# Patient Record
Sex: Male | Born: 1955 | Race: White | Hispanic: No | Marital: Married | Smoking: Current every day smoker
Health system: Southern US, Community
[De-identification: ages and names within clinical notes are randomized; demographics above are authoritative.]

## PROBLEM LIST (undated history)

## (undated) DIAGNOSIS — M25569 Pain in unspecified knee: Secondary | ICD-10-CM

## (undated) DIAGNOSIS — R569 Unspecified convulsions: Secondary | ICD-10-CM

## (undated) DIAGNOSIS — M1990 Unspecified osteoarthritis, unspecified site: Secondary | ICD-10-CM

## (undated) DIAGNOSIS — E119 Type 2 diabetes mellitus without complications: Secondary | ICD-10-CM

## (undated) DIAGNOSIS — M549 Dorsalgia, unspecified: Secondary | ICD-10-CM

## (undated) DIAGNOSIS — M797 Fibromyalgia: Secondary | ICD-10-CM

## (undated) HISTORY — PX: KNEE SURGERY: SHX244

---

## 2013-06-09 ENCOUNTER — Encounter (HOSPITAL_COMMUNITY): Payer: Self-pay | Admitting: Emergency Medicine

## 2013-06-09 ENCOUNTER — Emergency Department (HOSPITAL_COMMUNITY)
Admission: EM | Admit: 2013-06-09 | Discharge: 2013-06-09 | Disposition: A | Discharge status: Home / Self Care | Payer: Self-pay | Attending: Emergency Medicine | Admitting: Emergency Medicine

## 2013-06-09 DIAGNOSIS — E119 Type 2 diabetes mellitus without complications: Secondary | ICD-10-CM | POA: Insufficient documentation

## 2013-06-09 DIAGNOSIS — R55 Syncope and collapse: Secondary | ICD-10-CM | POA: Insufficient documentation

## 2013-06-09 DIAGNOSIS — F17200 Nicotine dependence, unspecified, uncomplicated: Secondary | ICD-10-CM | POA: Insufficient documentation

## 2013-06-09 DIAGNOSIS — Z8669 Personal history of other diseases of the nervous system and sense organs: Secondary | ICD-10-CM | POA: Insufficient documentation

## 2013-06-09 DIAGNOSIS — Z79899 Other long term (current) drug therapy: Secondary | ICD-10-CM | POA: Insufficient documentation

## 2013-06-09 DIAGNOSIS — Z794 Long term (current) use of insulin: Secondary | ICD-10-CM | POA: Insufficient documentation

## 2013-06-09 DIAGNOSIS — M549 Dorsalgia, unspecified: Secondary | ICD-10-CM | POA: Insufficient documentation

## 2013-06-09 HISTORY — DX: Fibromyalgia: M79.7

## 2013-06-09 HISTORY — DX: Type 2 diabetes mellitus without complications: E11.9

## 2013-06-09 HISTORY — DX: Dorsalgia, unspecified: M54.9

## 2013-06-09 HISTORY — DX: Unspecified convulsions: R56.9

## 2013-06-09 LAB — POCT I-STAT, CHEM 8
BUN: 17 mg/dL (ref 6–23)
Calcium, Ion: 1.2 mmol/L (ref 1.12–1.23)
Creatinine, Ser: 0.8 mg/dL (ref 0.50–1.35)
Potassium: 3.9 mEq/L (ref 3.5–5.1)
Sodium: 141 mEq/L (ref 135–145)
TCO2: 21 mmol/L (ref 0–100)

## 2013-06-09 LAB — POCT I-STAT TROPONIN I

## 2013-06-09 MED ORDER — DIAZEPAM 5 MG PO TABS
5.0000 mg | ORAL_TABLET | Freq: Once | ORAL | Status: AC
  Administered 2013-06-09: 5 mg via ORAL
  Filled 2013-06-09: qty 1

## 2013-06-09 MED ORDER — HYDROCODONE-ACETAMINOPHEN 5-325 MG PO TABS
1.0000 | ORAL_TABLET | Freq: Once | ORAL | Status: DC
  Filled 2013-06-09: qty 1

## 2013-06-09 MED ORDER — OXYCODONE-ACETAMINOPHEN 5-325 MG PO TABS
1.0000 | ORAL_TABLET | Freq: Once | ORAL | Status: AC
  Administered 2013-06-09: 1 via ORAL
  Filled 2013-06-09: qty 1

## 2013-06-09 MED ORDER — OXYCODONE-ACETAMINOPHEN 5-325 MG PO TABS: 1.0000 | ORAL_TABLET | Freq: Three times a day (TID) | ORAL | Status: DC | PRN

## 2013-06-09 MED ORDER — DIAZEPAM 5 MG PO TABS: 5.0000 mg | ORAL_TABLET | Freq: Two times a day (BID) | ORAL | Status: DC | PRN

## 2013-06-09 MED ORDER — HYDROCODONE-ACETAMINOPHEN 5-325 MG PO TABS: 1.0000 | ORAL_TABLET | Freq: Three times a day (TID) | ORAL | Status: DC | PRN

## 2013-06-09 NOTE — ED Notes (Signed)
MD at bedside. 

## 2013-06-09 NOTE — ED Notes (Signed)
Pt was outside his house in front yard. Bystander found him falling to his knees.  "I hurt my back."

## 2013-06-09 NOTE — ED Provider Notes (Signed)
CSN: [id number]     Arrival date & time 06/09/13  1004 History   First MD Initiated Contact with Patient 06/09/13 1005     Chief Complaint  Patient presents with  . Loss of Consciousness   (Consider location/radiation/quality/duration/timing/severity/associated sxs/prior Treatment) HPI Patient presents after a near-syncopal episode. On exam the patient has no active complaints beyond back pain, which he repeatedly states is unchanged from baseline. Today, just prior to EMS arrival, the patient had an episode of lightheadedness, while he had a back pain exacerbation.  He specifically denies chest pain, headache, confusion, disorientation. He states that he has had multiple similar episodes in the past, typically when he is feeling back pain exacerbation. He also denies any incontinence, bowel or bladder changes recently. Patient states that he takes his medication as directed.    Past Medical History  Diagnosis Date  . Back pain   . Diabetes mellitus without complication   . Fibromyalgia   . Seizures    Past Surgical History  Procedure Laterality Date  . Knee surgery     History reviewed. No pertinent family history. History  Substance Use Topics  . Smoking status: Current Every Day Smoker -- 1.00 packs/day    Types: Cigarettes  . Smokeless tobacco: Not on file  . Alcohol Use: No    Review of Systems  Constitutional:       Per HPI, otherwise negative  HENT:       Per HPI, otherwise negative  Respiratory:       Per HPI, otherwise negative  Cardiovascular:       Per HPI, otherwise negative  Gastrointestinal: Negative for vomiting.  Endocrine:       Negative aside from HPI  Genitourinary:       Neg aside from HPI   Musculoskeletal:       Per HPI, otherwise negative  Skin: Negative.   Neurological: Positive for light-headedness. Negative for syncope and headaches.    Allergies  Review of patient's allergies indicates no known allergies.  Home Medications    Current Outpatient Rx  Name  Route  Sig  Dispense  Refill  . insulin aspart (NOVOLOG) 100 UNIT/ML injection   Subcutaneous   Inject 30 Units into the skin daily before breakfast.         . metFORMIN (GLUCOPHAGE) 1000 MG tablet   Oral   Take 1,000 mg by mouth 2 (two) times daily with a meal.         . pregabalin (LYRICA) 300 MG capsule   Oral   Take 300 mg by mouth 3 (three) times daily.          BP 117/74  Pulse 93  Temp(Src) 97.6 F (36.4 C) (Oral)  Resp 16  SpO2 98% Physical Exam  Nursing note and vitals reviewed. Constitutional: He is oriented to person, place, and time. He appears well-developed. No distress.  HENT:  Head: Normocephalic and atraumatic.  Eyes: Conjunctivae and EOM are normal.  Cardiovascular: Normal rate and regular rhythm.   Pulmonary/Chest: Effort normal. No stridor. No respiratory distress.  Abdominal: He exhibits no distension.  Musculoskeletal: He exhibits no edema.  Neurological: He is alert and oriented to person, place, and time. He displays no atrophy and no tremor. No cranial nerve deficit or sensory deficit. He exhibits normal muscle tone. He displays no seizure activity. Coordination and gait normal.  Skin: Skin is warm and dry.  Psychiatric: He has a normal mood and affect.    ED Course  Procedures (including critical care time) Labs Review Labs Reviewed  GLUCOSE, CAPILLARY - Abnormal; Notable for the following:    Glucose-Capillary 381 (*)    All other components within normal limits  POCT CBG (FASTING - GLUCOSE)-MANUAL ENTRY   Imaging Review No results found.  EKG Interpretation   None      EKG has sinus rhythm, rate 88, no ischemic changes.   Update: Patient sleeping   Update: Patient's sleep   Update: I discussed all results again with the patient.  He continues to have low back pain, unchanged from baseline.  No additional episodes of near syncope, chest pain, lightheadedness. Patient will be discharged with  analgesics for his back pain, counseling on the need for medication compliance, and primary care followup. MDM  No diagnosis found. Patient returns after an episode of near syncope.  Patient endorses recurrent similar episodes, typically when he has back pain exacerbations, which he was experiencing today.  Patient describes no new retroflex for neurologic compromise, has no chest pain, no dysrhythmia on EKG, and there is low suspicion for atypical dysrhythmia.  Patient had substantial improvement, was sleeping here, and was appropriate for discharge after a period of monitoring.    Gerhard Munch, MD 06/09/13 1325

## 2013-06-09 NOTE — ED Notes (Signed)
Sela Hua (son) [phone] call when pt is ready to be discharged.

## 2013-06-09 NOTE — ED Notes (Signed)
"  I feel much better now. I feel perfect. The only thing that is bothering me is my back but that is normal."

## 2013-06-09 NOTE — ED Notes (Signed)
Pt refusing Vicodin, "it upsets my stomach."

## 2013-06-09 NOTE — ED Notes (Signed)
Pt requesting pain medication for back. MD notified.

## 2013-09-03 ENCOUNTER — Encounter (HOSPITAL_COMMUNITY): Payer: Self-pay | Admitting: Emergency Medicine

## 2013-09-03 ENCOUNTER — Emergency Department (HOSPITAL_COMMUNITY)
Admission: EM | Admit: 2013-09-03 | Discharge: 2013-09-03 | Disposition: A | Discharge status: Home / Self Care | Payer: BC Managed Care – PPO | Attending: Emergency Medicine | Admitting: Emergency Medicine

## 2013-09-03 ENCOUNTER — Emergency Department (HOSPITAL_COMMUNITY): Payer: BC Managed Care – PPO

## 2013-09-03 DIAGNOSIS — M25569 Pain in unspecified knee: Secondary | ICD-10-CM | POA: Insufficient documentation

## 2013-09-03 DIAGNOSIS — IMO0001 Reserved for inherently not codable concepts without codable children: Secondary | ICD-10-CM | POA: Insufficient documentation

## 2013-09-03 DIAGNOSIS — Z794 Long term (current) use of insulin: Secondary | ICD-10-CM | POA: Insufficient documentation

## 2013-09-03 DIAGNOSIS — Z79899 Other long term (current) drug therapy: Secondary | ICD-10-CM | POA: Insufficient documentation

## 2013-09-03 DIAGNOSIS — F17200 Nicotine dependence, unspecified, uncomplicated: Secondary | ICD-10-CM | POA: Insufficient documentation

## 2013-09-03 DIAGNOSIS — M25562 Pain in left knee: Secondary | ICD-10-CM

## 2013-09-03 DIAGNOSIS — M549 Dorsalgia, unspecified: Secondary | ICD-10-CM | POA: Insufficient documentation

## 2013-09-03 DIAGNOSIS — R569 Unspecified convulsions: Secondary | ICD-10-CM | POA: Insufficient documentation

## 2013-09-03 DIAGNOSIS — E119 Type 2 diabetes mellitus without complications: Secondary | ICD-10-CM | POA: Insufficient documentation

## 2013-09-03 DIAGNOSIS — G8929 Other chronic pain: Secondary | ICD-10-CM | POA: Insufficient documentation

## 2013-09-03 MED ORDER — OXYCODONE-ACETAMINOPHEN 5-325 MG PO TABS
2.0000 | ORAL_TABLET | Freq: Once | ORAL | Status: AC
  Administered 2013-09-03: 2 via ORAL
  Filled 2013-09-03: qty 2

## 2013-09-03 MED ORDER — OXYCODONE HCL 5 MG PO TABS: 5.0000 mg | ORAL_TABLET | ORAL | Status: DC | PRN

## 2013-09-03 NOTE — ED Notes (Signed)
Pt is scheduled for knee surgery, pt is here with severe left knee pain.  CBG 150-200.

## 2013-09-03 NOTE — ED Notes (Signed)
Pt states that he is a [employer] and works long hours with physical work. Pt states that his left knee is hurting him more than usual. Pt has bone on bone from CT scan earlier this year and needs a replacement. Pt state that he took his wife's medication (dilaudid she is a cancer patient) and that only helped for a couple of hours.

## 2013-09-03 NOTE — ED Provider Notes (Signed)
CSN: [id number]     Arrival date & time 09/03/13  1647 History  This chart was scribed for non-physician practitioner Antony MaduraKelly Faelyn Sigler, PA-C working with Hurman HornJohn M Bednar, MD by Donne Anonayla Curran, ED Scribe. This patient was seen in room TR11C/TR11C and the patient's care was started at 1813.  First MD Initiated Contact with Patient 09/03/13 1813     Chief Complaint  Patient presents with  . Knee Pain    The history is provided by the patient. No language interpreter was used.   HPI Comments: Charles Blevins is a 58 y.o. male with hx of DM, fibromyalgia and seizures, who presents to the Emergency Department complaining of chronic gradual onset, gradually worsening, severe left knee pain described as stabbing and burning which acutely worsened over the past few days due to strenuous activity at work. He denies recent trauma or injury. He tried ibuprofen, Aleve, Percocet, knee brace, and ice with little relief. [place] but [place] was [place] due to his uncontrolled DM (his CBG used to be >500). He denies fever, redness of the knee or any other symptoms. He has tried diet, insulin and Metformin to control his diabetes.   Past Medical History  Diagnosis Date  . Back pain   . Diabetes mellitus without complication   . Fibromyalgia   . Seizures    Past Surgical History  Procedure Laterality Date  . Knee surgery     No family history on file. History  Substance Use Topics  . Smoking status: Current Every Day Smoker -- 1.00 packs/day    Types: Cigarettes  . Smokeless tobacco: Not on file  . Alcohol Use: No    Review of Systems  Musculoskeletal: Positive for arthralgias.    Allergies  Review of patient's allergies indicates no known allergies.  Home Medications   Current Outpatient Rx  Name  Route  Sig  Dispense  Refill  . diazepam (VALIUM) 5 MG tablet   Oral   Take 1 tablet (5 mg total) by mouth every 12 (twelve) hours as needed (muscle spasm).   15  tablet   0   . insulin aspart (NOVOLOG) 100 UNIT/ML injection   Subcutaneous   Inject 10-16 Units into the skin 3 (three) times daily before meals.          . insulin glargine (LANTUS) 100 UNIT/ML injection   Subcutaneous   Inject 30 Units into the skin at bedtime.         . metFORMIN (GLUCOPHAGE) 1000 MG tablet   Oral   Take 1,000 mg by mouth 2 (two) times daily with a meal.         . oxyCODONE (ROXICODONE) 5 MG immediate release tablet   Oral   Take 1 tablet (5 mg total) by mouth every 4 (four) hours as needed for severe pain.   27 tablet   0   . oxyCODONE-acetaminophen (PERCOCET/ROXICET) 5-325 MG per tablet   Oral   Take 1 tablet by mouth every 8 (eight) hours as needed for pain.   15 tablet   0   . pregabalin (LYRICA) 300 MG capsule   Oral   Take 300 mg by mouth 3 (three) times daily.          BP 131/88  Pulse 86  Temp(Src) 98.1 F (36.7 C) (Oral)  Resp 16  Wt 230 lb (104.327 kg)  SpO2 96%  Physical Exam  Nursing note and vitals reviewed. Constitutional: He is  oriented to person, place, and time. He appears well-developed and well-nourished. No distress.  HENT:  Head: Normocephalic and atraumatic.  Eyes: Conjunctivae and EOM are normal. No scleral icterus.  Neck: Normal range of motion.  Cardiovascular: Normal rate, regular rhythm and intact distal pulses.   DP and PT pulses 2+ in left lower extremity  Pulmonary/Chest: Effort normal. No respiratory distress.  Musculoskeletal: Normal range of motion. He exhibits tenderness. He exhibits no edema.  Normal range of motion of left knee. Tenderness to palpation diffusely to the anterior knee joint, especially inferior to patella and to medial and lateral joint lines. 5 out of 5 strength resistance with flexion and extension of knee. No laxity appreciated. No swelling, erythema, effusions, or deformities.  Neurological: He is alert and oriented to person, place, and time. He has normal reflexes.  No gross  sensory deficits appreciated.  Skin: Skin is warm and dry. No rash noted. He is not diaphoretic. No erythema. No pallor.  Psychiatric: He has a normal mood and affect. His behavior is normal.    ED Course  Procedures (including critical care time) DIAGNOSTIC STUDIES: Oxygen Saturation is 97% on RA, adequate by my interpretation.    COORDINATION OF CARE: 6:55 PM Discussed treatment plan which includes 5-325 mg Percocet in Ed with pt at bedside and pt agreed to plan. Imaging discussed. Will give a note for work. Advised pt to follow up with orthopedist, referral given. Will discharge home with 5 mg Roxicodone tablets.    Labs Review Labs Reviewed - No data to display Imaging Review Dg Knee Complete 4 Views Left  09/03/2013   CLINICAL DATA:  Left knee pain  EXAM: LEFT KNEE - COMPLETE 4+ VIEW  COMPARISON:  None.  FINDINGS: No acute fracture dislocation. No joint effusion. Moderate degenerative joint space narrowing with juxta-articular osteophytosis is present at the medial joint space compartment. Minimal degenerative spurring seen at the inferior aspect of the patella. Corticated density at the posterior aspect of the femorotibial joint space on lateral projection may represent a loose body. No soft tissue swelling. Osseous mineralization is normal.  IMPRESSION: 1. 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 2. No acute fracture or dislocation.  No joint effusion.   Electronically Signed   By: Rise Mu M.D.   On: 09/03/2013 18:42    EKG Interpretation   None       MDM   1. Knee pain, left    Uncomplicated left knee pain. Patient neurovascularly intact. No gross sensory deficits appreciated. Normal reflexes in left lower extremity as well as strength against resistance with knee flexion and extension. X-ray negative for fracture or dislocation. No evidence of septic joint. Patient stable for discharge with prescription for  oxycodone for pain control. Orthopedic referral provided. Return precautions discussed and patient agreeable to plan with no unaddressed concerns.  I personally performed the services described in this documentation, which was scribed in my presence. The recorded information has been reviewed and is accurate.    Antony Madura, PA-C 09/03/13 1952

## 2013-09-03 NOTE — Discharge Instructions (Signed)
°Arthralgia °Your caregiver has diagnosed you as suffering from an arthralgia. Arthralgia means there is pain in a joint. This can come from many reasons including: °· Bruising the joint which causes soreness (inflammation) in the joint. °· Wear and tear on the joints which occur as we grow older (osteoarthritis). °· Overusing the joint. °· Various forms of arthritis. °· Infections of the joint. °Regardless of the cause of pain in your joint, most of these different pains respond to anti-inflammatory drugs and rest. The exception to this is when a joint is infected, and these cases are treated with antibiotics, if it is a bacterial infection. °HOME CARE INSTRUCTIONS  °· Rest the injured area for as long as directed by your caregiver. Then slowly start using the joint as directed by your caregiver and as the pain allows. Crutches as directed may be useful if the ankles, knees or hips are involved. If the knee was splinted or casted, continue use and care as directed. If an stretchy or elastic wrapping bandage has been applied today, it should be removed and re-applied every 3 to 4 hours. It should not be applied tightly, but firmly enough to keep swelling down. Watch toes and feet for swelling, bluish discoloration, coldness, numbness or excessive pain. If any of these problems (symptoms) occur, remove the ace bandage and re-apply more loosely. If these symptoms persist, contact your caregiver or return to this location. °· For the first 24 hours, keep the injured extremity elevated on pillows while lying down. °· Apply ice for 15-20 minutes to the sore joint every couple hours while awake for the first half day. Then 03-04 times per day for the first 48 hours. Put the ice in a plastic bag and place a towel between the bag of ice and your skin. °· Wear any splinting, casting, elastic bandage applications, or slings as instructed. °· Only take over-the-counter or prescription medicines for pain, discomfort, or fever  as directed by your caregiver. Do not use aspirin immediately after the injury unless instructed by your physician. Aspirin can cause increased bleeding and bruising of the tissues. °· If you were given crutches, continue to use them as instructed and do not resume weight bearing on the sore joint until instructed. °Persistent pain and inability to use the sore joint as directed for more than 2 to 3 days are warning signs indicating that you should see a caregiver for a follow-up visit as soon as possible. Initially, a hairline fracture (break in bone) may not be evident on X-rays. Persistent pain and swelling indicate that further evaluation, non-weight bearing or use of the joint (use of crutches or slings as instructed), or further X-rays are indicated. X-rays may sometimes not show a small fracture until a week or 10 days later. Make a follow-up appointment with your own caregiver or one to whom we have referred you. A radiologist (specialist in reading X-rays) may read your X-rays. Make sure you know how you are to obtain your X-ray results. Do not assume everything is normal if you do not hear from us. °SEEK MEDICAL CARE IF: °Bruising, swelling, or pain increases. °SEEK IMMEDIATE MEDICAL CARE IF:  °· Your fingers or toes are numb or blue. °· The pain is not responding to medications and continues to stay the same or get worse. °· The pain in your joint becomes severe. °· You develop a fever over 102° F (38.9° C). °· It becomes impossible to move or use the joint. °MAKE SURE YOU:  °·   Understand these instructions. °· Will watch your condition. °· Will get help right away if you are not doing well or get worse. °Document Released: 08/02/2005 Document Revised: 10/25/2011 Document Reviewed: 03/20/2008 °ExitCare® Patient Information ©2014 [employer]. °RICE: Routine Care for Injuries °The routine care of many injuries includes Rest, Ice, Compression, and Elevation (RICE). °HOME CARE INSTRUCTIONS °· Rest is needed  to allow your body to heal. Routine activities can usually be resumed when comfortable. Injured tendons and bones can take up to 6 weeks to heal. Tendons are the cord-like structures that attach muscle to bone. °· Ice following an injury helps keep the swelling down and reduces pain. °· Put ice in a plastic bag. °· Place a towel between your skin and the bag. °· Leave the ice on for 15-20 minutes, 03-04 times a day. Do this while awake, for the first 24 to 48 hours. After that, continue as directed by your caregiver. °· Compression helps keep swelling down. It also gives support and helps with discomfort. If an elastic bandage has been applied, it should be removed and reapplied every 3 to 4 hours. It should not be applied tightly, but firmly enough to keep swelling down. Watch fingers or toes for swelling, bluish discoloration, coldness, numbness, or excessive pain. If any of these problems occur, remove the bandage and reapply loosely. Contact your caregiver if these problems continue. °· Elevation helps reduce swelling and decreases pain. With extremities, such as the arms, hands, legs, and feet, the injured area should be placed near or above the level of the heart, if possible. °SEEK IMMEDIATE MEDICAL CARE IF: °· You have persistent pain and swelling. °· You develop redness, numbness, or unexpected weakness. °· Your symptoms are getting worse rather than improving after several days. °These symptoms may indicate that further evaluation or further X-rays are needed. Sometimes, X-rays may not show a small broken bone (fracture) until 1 week or 10 days later. Make a follow-up appointment with your caregiver. Ask when your X-ray results will be ready. Make sure you get your X-ray results. °Document Released: 11/14/2000 Document Revised: 10/25/2011 Document Reviewed: 01/01/2011 °ExitCare® Patient Information ©2014 [employer]. ° °

## 2013-09-06 NOTE — ED Provider Notes (Signed)
Medical screening examination/treatment/procedure(s) were performed by non-physician practitioner and as supervising physician I was immediately available for consultation/collaboration.  Sahil Milner M Avyn Coate, MD 09/06/13 1854 

## 2013-09-30 ENCOUNTER — Encounter (HOSPITAL_COMMUNITY): Payer: Self-pay | Admitting: Emergency Medicine

## 2013-09-30 ENCOUNTER — Emergency Department (HOSPITAL_COMMUNITY)
Admission: EM | Admit: 2013-09-30 | Discharge: 2013-09-30 | Disposition: A | Discharge status: Home / Self Care | Payer: BC Managed Care – PPO | Attending: Emergency Medicine | Admitting: Emergency Medicine

## 2013-09-30 DIAGNOSIS — G8929 Other chronic pain: Secondary | ICD-10-CM | POA: Insufficient documentation

## 2013-09-30 DIAGNOSIS — Z794 Long term (current) use of insulin: Secondary | ICD-10-CM | POA: Insufficient documentation

## 2013-09-30 DIAGNOSIS — G40909 Epilepsy, unspecified, not intractable, without status epilepticus: Secondary | ICD-10-CM | POA: Insufficient documentation

## 2013-09-30 DIAGNOSIS — Z79899 Other long term (current) drug therapy: Secondary | ICD-10-CM | POA: Insufficient documentation

## 2013-09-30 DIAGNOSIS — Z9889 Other specified postprocedural states: Secondary | ICD-10-CM | POA: Insufficient documentation

## 2013-09-30 DIAGNOSIS — M25569 Pain in unspecified knee: Secondary | ICD-10-CM | POA: Insufficient documentation

## 2013-09-30 DIAGNOSIS — F17200 Nicotine dependence, unspecified, uncomplicated: Secondary | ICD-10-CM | POA: Insufficient documentation

## 2013-09-30 DIAGNOSIS — IMO0001 Reserved for inherently not codable concepts without codable children: Secondary | ICD-10-CM | POA: Insufficient documentation

## 2013-09-30 DIAGNOSIS — E119 Type 2 diabetes mellitus without complications: Secondary | ICD-10-CM | POA: Insufficient documentation

## 2013-09-30 DIAGNOSIS — M5430 Sciatica, unspecified side: Secondary | ICD-10-CM

## 2013-09-30 MED ORDER — OXYCODONE-ACETAMINOPHEN 5-325 MG PO TABS
1.0000 | ORAL_TABLET | Freq: Once | ORAL | Status: AC
  Administered 2013-09-30: 1 via ORAL
  Filled 2013-09-30: qty 1

## 2013-09-30 MED ORDER — OXYCODONE-ACETAMINOPHEN 5-325 MG PO TABS
2.0000 | ORAL_TABLET | ORAL | Status: DC | PRN
Start: 2013-09-30 — End: 2013-11-07

## 2013-09-30 MED ORDER — IBUPROFEN 800 MG PO TABS: 800.0000 mg | ORAL_TABLET | Freq: Three times a day (TID) | ORAL | Status: DC

## 2013-09-30 MED ORDER — IBUPROFEN 400 MG PO TABS
800.0000 mg | ORAL_TABLET | Freq: Once | ORAL | Status: AC
  Administered 2013-09-30: 800 mg via ORAL
  Filled 2013-09-30: qty 2

## 2013-09-30 NOTE — ED Notes (Signed)
Patient discharged to home. NAD.  

## 2013-09-30 NOTE — ED Provider Notes (Signed)
CSN: [id number]     Arrival date & time 09/30/13  0920 History  This chart was scribed for Charles AnisShari Mana Haberl, non-physician practitioner, working with Rolland PorterMark James, MD, by Ellin MayhewMichael Levi, ED Scribe. This patient was seen in room TR11C/TR11C and the patient's care was started at 9:50 AM.  Chief Complaint  Patient presents with  . Back Pain   Patient is a 58 y.o. male presenting with back pain.  Back Pain Associated symptoms: no fever and no weakness    HPI Comments: Charles Blevins is a 58 y.o. male who presents to the Emergency Department complaining of L lower back pain with onset yesterday. Patient reports that he has had chronic back and knee pain which is intermittent and waxes and wanes; however, was at its worse yesterday night. He confirms this being the thrid of such episodes. Patient characterizes the pain as sharp and radiating down his L thigh with diffuse L flank pain. The pain is made worse with movement (bending) of the L knee and change in position. Patient denies any abdominal pain, urinary or bowel incontinence. He denies any allergies to medication; however, he states large percocet medication give him nausea. Patient has a history of DM, and seizures. He does have a history of knee surgery.   Past Medical History  Diagnosis Date  . Back pain   . Diabetes mellitus without complication   . Fibromyalgia   . Seizures    Past Surgical History  Procedure Laterality Date  . Knee surgery     No family history on file. History  Substance Use Topics  . Smoking status: Current Every Day Smoker -- 1.00 packs/day    Types: Cigarettes  . Smokeless tobacco: Not on file  . Alcohol Use: No    Review of Systems  Constitutional: Negative for fever, chills, activity change and appetite change.  Respiratory: Negative for shortness of breath.   Gastrointestinal: Negative for nausea, vomiting and diarrhea.  Genitourinary: Negative for difficulty urinating.  Musculoskeletal: Positive for back  pain (L lower back, L knee, and L flank.).  Neurological: Negative for weakness.  All other systems reviewed and are negative.   Allergies  Review of patient's allergies indicates no known allergies.  Home Medications   Current Outpatient Rx  Name  Route  Sig  Dispense  Refill  . diazepam (VALIUM) 5 MG tablet   Oral   Take 1 tablet (5 mg total) by mouth every 12 (twelve) hours as needed (muscle spasm).   15 tablet   0   . insulin aspart (NOVOLOG) 100 UNIT/ML injection   Subcutaneous   Inject 10-16 Units into the skin 3 (three) times daily before meals.          . insulin glargine (LANTUS) 100 UNIT/ML injection   Subcutaneous   Inject 30 Units into the skin at bedtime.         . metFORMIN (GLUCOPHAGE) 1000 MG tablet   Oral   Take 1,000 mg by mouth 2 (two) times daily with a meal.         . oxyCODONE (ROXICODONE) 5 MG immediate release tablet   Oral   Take 1 tablet (5 mg total) by mouth every 4 (four) hours as needed for severe pain.   27 tablet   0   . oxyCODONE-acetaminophen (PERCOCET/ROXICET) 5-325 MG per tablet   Oral   Take 1 tablet by mouth every 8 (eight) hours as needed for pain.   15 tablet   0   .  pregabalin (LYRICA) 300 MG capsule   Oral   Take 300 mg by mouth 3 (three) times daily.          Triage Vitals: BP 123/77  Pulse 84  Temp(Src) 97.2 F (36.2 C) (Oral)  Resp 20  Ht 5\' 8"  (1.727 m)  Wt 230 lb (104.327 kg)  BMI 34.98 kg/m2  SpO2 98%  Physical Exam  Nursing note and vitals reviewed. Constitutional: He is oriented to person, place, and time. He appears well-developed and well-nourished. No distress.  HENT:  Head: Normocephalic and atraumatic.  Eyes: Conjunctivae and EOM are normal.  Neck: Neck supple.  Cardiovascular: Intact distal pulses.   Pulmonary/Chest: No respiratory distress.  Abdominal: Soft. There is no tenderness.  Musculoskeletal: Normal range of motion.  Tenderness to L sciatic nerve.  Neurological: He is alert and  oriented to person, place, and time.  Reflex Scores:      Patellar reflexes are 2+ on the left side. Skin: Skin is warm and dry.  Psychiatric: He has a normal mood and affect. His behavior is normal.    ED Course  Procedures (including critical care time)  DIAGNOSTIC STUDIES: Oxygen Saturation is 98% on room air, normal by my interpretation.    COORDINATION OF CARE: 9:53 AM-Recommended taking elevated levels of anti-inflammatory medication (ibuprofen 800mg  every 8 hours) and f/u with an orthopedic physician. Also recommendeded using ice and heat to help relax the area. Will prescribe motrin pain medication for patient. Treatment plan discussed with patient and patient agrees.  Labs Review Labs Reviewed - No data to display Imaging Review No results found.  EKG Interpretation   None      MDM   Final diagnoses:  None    1. Sciatica   Uncomplicated recurrent sciatica.   I personally performed the services described in this documentation, which was scribed in my presence. The recorded information has been reviewed and is accurate.     Arnoldo Hooker, PA-C 09/30/13 1426

## 2013-09-30 NOTE — Discharge Instructions (Signed)
Sciatica °Sciatica is pain, weakness, numbness, or tingling along the path of the sciatic nerve. The nerve starts in the lower back and runs down the back of each leg. The nerve controls the muscles in the lower leg and in the back of the knee, while also providing sensation to the back of the thigh, lower leg, and the sole of your foot. Sciatica is a symptom of another medical condition. For instance, nerve damage or certain conditions, such as a herniated disk or bone spur on the spine, pinch or put pressure on the sciatic nerve. This causes the pain, weakness, or other sensations normally associated with sciatica. Generally, sciatica only affects one side of the body. °CAUSES  °· Herniated or slipped disc. °· Degenerative disk disease. °· A pain disorder involving the narrow muscle in the buttocks (piriformis syndrome). °· Pelvic injury or fracture. °· Pregnancy. °· Tumor (rare). °SYMPTOMS  °Symptoms can vary from mild to very severe. The symptoms usually travel from the low back to the buttocks and down the back of the leg. Symptoms can include: °· Mild tingling or dull aches in the lower back, leg, or hip. °· Numbness in the back of the calf or sole of the foot. °· Burning sensations in the lower back, leg, or hip. °· Sharp pains in the lower back, leg, or hip. °· Leg weakness. °· Severe back pain inhibiting movement. °These symptoms may get worse with coughing, sneezing, laughing, or prolonged sitting or standing. Also, being overweight may worsen symptoms. °DIAGNOSIS  °Your caregiver will perform a physical exam to look for common symptoms of sciatica. He or she may ask you to do certain movements or activities that would trigger sciatic nerve pain. Other tests may be performed to find the cause of the sciatica. These may include: °· Blood tests. °· X-rays. °· Imaging tests, such as an MRI or CT scan. °TREATMENT  °Treatment is directed at the cause of the sciatic pain. Sometimes, treatment is not necessary  and the pain and discomfort goes away on its own. If treatment is needed, your caregiver may suggest: °· Over-the-counter medicines to relieve pain. °· Prescription medicines, such as anti-inflammatory medicine, muscle relaxants, or narcotics. °· Applying heat or ice to the painful area. °· Steroid injections to lessen pain, irritation, and inflammation around the nerve. °· Reducing activity during periods of pain. °· Exercising and stretching to strengthen your abdomen and improve flexibility of your spine. Your caregiver may suggest losing weight if the extra weight makes the back pain worse. °· Physical therapy. °· Surgery to eliminate what is pressing or pinching the nerve, such as a bone spur or part of a herniated disk. °HOME CARE INSTRUCTIONS  °· Only take over-the-counter or prescription medicines for pain or discomfort as directed by your caregiver. °· Apply ice to the affected area for 20 minutes, 3 4 times a day for the first 48 72 hours. Then try heat in the same way. °· Exercise, stretch, or perform your usual activities if these do not aggravate your pain. °· Attend physical therapy sessions as directed by your caregiver. °· Keep all follow-up appointments as directed by your caregiver. °· Do not wear high heels or shoes that do not provide proper support. °· Check your mattress to see if it is too soft. A firm mattress may lessen your pain and discomfort. °SEEK IMMEDIATE MEDICAL CARE IF:  °· You lose control of your bowel or bladder (incontinence). °· You have increasing weakness in the lower back,   pelvis, buttocks, or legs. °· You have redness or swelling of your back. °· You have a burning sensation when you urinate. °· You have pain that gets worse when you lie down or awakens you at night. °· Your pain is worse than you have experienced in the past. °· Your pain is lasting longer than 4 weeks. °· You are suddenly losing weight without reason. °MAKE SURE YOU: °· Understand these  instructions. °· Will watch your condition. °· Will get help right away if you are not doing well or get worse. °Document Released: 07/27/2001 Document Revised: 02/01/2012 Document Reviewed: 12/12/2011 °[employer]® Patient Information ©2014 [employer]. ° °

## 2013-09-30 NOTE — ED Notes (Signed)
Patient presents to ED for complaints of left lower back pain since yesterday. Patient reports pain goes down his left thigh. Describes pain as sharp.

## 2013-09-30 NOTE — ED Notes (Signed)
Patient states he took advil, and aleve without relief.

## 2013-10-05 NOTE — ED Provider Notes (Signed)
Medical screening examination/treatment/procedure(s) were performed by non-physician practitioner and as supervising physician I was immediately available for consultation/collaboration.  EKG Interpretation   None         Mayukha Symmonds, MD 10/05/13 0821 

## 2013-11-07 ENCOUNTER — Emergency Department (HOSPITAL_COMMUNITY): Payer: BC Managed Care – PPO

## 2013-11-07 ENCOUNTER — Encounter (HOSPITAL_COMMUNITY): Payer: Self-pay | Admitting: Emergency Medicine

## 2013-11-07 ENCOUNTER — Observation Stay (HOSPITAL_COMMUNITY)
Admission: EM | Admit: 2013-11-07 | Discharge: 2013-11-08 | Disposition: A | Discharge status: Home / Self Care | Payer: BC Managed Care – PPO | Attending: Cardiovascular Disease | Admitting: Cardiovascular Disease

## 2013-11-07 DIAGNOSIS — R0602 Shortness of breath: Secondary | ICD-10-CM | POA: Insufficient documentation

## 2013-11-07 DIAGNOSIS — G40909 Epilepsy, unspecified, not intractable, without status epilepticus: Secondary | ICD-10-CM | POA: Insufficient documentation

## 2013-11-07 DIAGNOSIS — R55 Syncope and collapse: Secondary | ICD-10-CM

## 2013-11-07 DIAGNOSIS — F17200 Nicotine dependence, unspecified, uncomplicated: Secondary | ICD-10-CM | POA: Insufficient documentation

## 2013-11-07 DIAGNOSIS — Z79899 Other long term (current) drug therapy: Secondary | ICD-10-CM | POA: Insufficient documentation

## 2013-11-07 DIAGNOSIS — R072 Precordial pain: Secondary | ICD-10-CM

## 2013-11-07 DIAGNOSIS — I2510 Atherosclerotic heart disease of native coronary artery without angina pectoris: Secondary | ICD-10-CM | POA: Insufficient documentation

## 2013-11-07 DIAGNOSIS — R209 Unspecified disturbances of skin sensation: Secondary | ICD-10-CM | POA: Insufficient documentation

## 2013-11-07 DIAGNOSIS — Z794 Long term (current) use of insulin: Secondary | ICD-10-CM | POA: Insufficient documentation

## 2013-11-07 DIAGNOSIS — R0789 Other chest pain: Principal | ICD-10-CM | POA: Insufficient documentation

## 2013-11-07 DIAGNOSIS — E785 Hyperlipidemia, unspecified: Secondary | ICD-10-CM

## 2013-11-07 DIAGNOSIS — M797 Fibromyalgia: Secondary | ICD-10-CM | POA: Diagnosis present

## 2013-11-07 DIAGNOSIS — L74519 Primary focal hyperhidrosis, unspecified: Secondary | ICD-10-CM | POA: Insufficient documentation

## 2013-11-07 DIAGNOSIS — R079 Chest pain, unspecified: Secondary | ICD-10-CM

## 2013-11-07 DIAGNOSIS — E119 Type 2 diabetes mellitus without complications: Secondary | ICD-10-CM | POA: Insufficient documentation

## 2013-11-07 DIAGNOSIS — E1169 Type 2 diabetes mellitus with other specified complication: Secondary | ICD-10-CM

## 2013-11-07 DIAGNOSIS — M1990 Unspecified osteoarthritis, unspecified site: Secondary | ICD-10-CM

## 2013-11-07 HISTORY — DX: Unspecified osteoarthritis, unspecified site: M19.90

## 2013-11-07 LAB — CBC
HCT: 44.2 % (ref 39.0–52.0)
Hemoglobin: 15.7 g/dL (ref 13.0–17.0)
MCH: 30.3 pg (ref 26.0–34.0)
MCHC: 35.5 g/dL (ref 30.0–36.0)
MCV: 85.3 fL (ref 78.0–100.0)
PLATELETS: 205 10*3/uL (ref 150–400)
RBC: 5.18 MIL/uL (ref 4.22–5.81)
RDW: 13.6 % (ref 11.5–15.5)
WBC: 6.9 10*3/uL (ref 4.0–10.5)

## 2013-11-07 LAB — T4, FREE: Free T4: 1.26 ng/dL (ref 0.80–1.80)

## 2013-11-07 LAB — BASIC METABOLIC PANEL
BUN: 14 mg/dL (ref 6–23)
CALCIUM: 9.4 mg/dL (ref 8.4–10.5)
CO2: 22 mEq/L (ref 19–32)
CREATININE: 0.54 mg/dL (ref 0.50–1.35)
Chloride: 100 mEq/L (ref 96–112)
GFR calc Af Amer: >90 mL/min (ref >90)
Glucose, Bld: 311 mg/dL — ABNORMAL HIGH (ref 70–99)
Potassium: 4.4 mEq/L (ref 3.7–5.3)
Sodium: 135 mEq/L — ABNORMAL LOW (ref 137–147)

## 2013-11-07 LAB — HEMOGLOBIN A1C
Hgb A1c MFr Bld: 10.9 % — ABNORMAL HIGH (ref <5.7)
Mean Plasma Glucose: 266 mg/dL — ABNORMAL HIGH (ref <117)

## 2013-11-07 LAB — TROPONIN I: Troponin I: <0.3 ng/mL (ref <0.30)

## 2013-11-07 LAB — PROTIME-INR
INR: 0.92 (ref 0.00–1.49)
PROTHROMBIN TIME: 12.2 s (ref 11.6–15.2)

## 2013-11-07 LAB — I-STAT TROPONIN, ED: Troponin i, poc: 0 ng/mL (ref 0.00–0.08)

## 2013-11-07 LAB — GLUCOSE, CAPILLARY: GLUCOSE-CAPILLARY: 338 mg/dL — AB (ref 70–99)

## 2013-11-07 LAB — PRO B NATRIURETIC PEPTIDE: Pro B Natriuretic peptide (BNP): 22.8 pg/mL (ref 0–125)

## 2013-11-07 LAB — MAGNESIUM: Magnesium: 1.4 mg/dL — ABNORMAL LOW (ref 1.5–2.5)

## 2013-11-07 LAB — APTT: APTT: 37 s (ref 24–37)

## 2013-11-07 LAB — D-DIMER, QUANTITATIVE (NOT AT ARMC): D DIMER QUANT: 0.31 ug{FEU}/mL (ref 0.00–0.48)

## 2013-11-07 LAB — TSH: TSH: 1.099 u[IU]/mL (ref 0.350–4.500)

## 2013-11-07 MED ORDER — MORPHINE SULFATE 2 MG/ML IJ SOLN
2.0000 mg | INTRAMUSCULAR | Status: DC | PRN
  Administered 2013-11-07 – 2013-11-08 (×4): 2 mg via INTRAVENOUS
  Filled 2013-11-07 (×3): qty 1

## 2013-11-07 MED ORDER — ASPIRIN EC 81 MG PO TBEC
81.0000 mg | DELAYED_RELEASE_TABLET | Freq: Every day | ORAL | Status: DC
  Administered 2013-11-08: 81 mg via ORAL
  Filled 2013-11-07: qty 1

## 2013-11-07 MED ORDER — PREGABALIN 75 MG PO CAPS
300.0000 mg | ORAL_CAPSULE | Freq: Three times a day (TID) | ORAL | Status: DC
  Administered 2013-11-07 – 2013-11-08 (×2): 300 mg via ORAL
  Filled 2013-11-07 (×2): qty 4

## 2013-11-07 MED ORDER — ASPIRIN 300 MG RE SUPP: 300.0000 mg | RECTAL | Status: DC

## 2013-11-07 MED ORDER — INSULIN GLARGINE 100 UNIT/ML ~~LOC~~ SOLN
15.0000 [IU] | Freq: Every day | SUBCUTANEOUS | Status: DC
  Administered 2013-11-07: 15 [IU] via SUBCUTANEOUS
  Filled 2013-11-07 (×2): qty 0.15

## 2013-11-07 MED ORDER — SODIUM CHLORIDE 0.9 % IJ SOLN
3.0000 mL | Freq: Two times a day (BID) | INTRAMUSCULAR | Status: DC
  Administered 2013-11-07: 3 mL via INTRAVENOUS

## 2013-11-07 MED ORDER — METOPROLOL TARTRATE 12.5 MG HALF TABLET
12.5000 mg | ORAL_TABLET | Freq: Two times a day (BID) | ORAL | Status: DC
  Administered 2013-11-07 – 2013-11-08 (×2): 12.5 mg via ORAL
  Filled 2013-11-07 (×3): qty 1

## 2013-11-07 MED ORDER — ASPIRIN 81 MG PO CHEW: 324.0000 mg | CHEWABLE_TABLET | ORAL | Status: DC

## 2013-11-07 MED ORDER — MORPHINE SULFATE 4 MG/ML IJ SOLN
4.0000 mg | Freq: Once | INTRAMUSCULAR | Status: AC
  Administered 2013-11-07: 4 mg via INTRAVENOUS
  Filled 2013-11-07: qty 1

## 2013-11-07 MED ORDER — AMITRIPTYLINE HCL 10 MG PO TABS
10.0000 mg | ORAL_TABLET | Freq: Every evening | ORAL | Status: DC | PRN
  Filled 2013-11-07: qty 1

## 2013-11-07 MED ORDER — ALPRAZOLAM 0.5 MG PO TABS: 0.5000 mg | ORAL_TABLET | Freq: Three times a day (TID) | ORAL | Status: DC | PRN

## 2013-11-07 MED ORDER — PNEUMOCOCCAL VAC POLYVALENT 25 MCG/0.5ML IJ INJ
0.5000 mL | INJECTION | INTRAMUSCULAR | Status: DC
Start: 2013-11-08 — End: 2013-11-08
  Filled 2013-11-07: qty 0.5

## 2013-11-07 MED ORDER — INFLUENZA VAC SPLIT QUAD 0.5 ML IM SUSP
0.5000 mL | INTRAMUSCULAR | Status: DC
  Filled 2013-11-07: qty 0.5

## 2013-11-07 MED ORDER — ONDANSETRON HCL 4 MG/2ML IJ SOLN: 4.0000 mg | Freq: Four times a day (QID) | INTRAMUSCULAR | Status: DC | PRN

## 2013-11-07 MED ORDER — INSULIN ASPART 100 UNIT/ML ~~LOC~~ SOLN
0.0000 [IU] | Freq: Three times a day (TID) | SUBCUTANEOUS | Status: DC
  Administered 2013-11-08: 3 [IU] via SUBCUTANEOUS

## 2013-11-07 MED ORDER — ATORVASTATIN CALCIUM 40 MG PO TABS
40.0000 mg | ORAL_TABLET | Freq: Every day | ORAL | Status: DC
  Filled 2013-11-07: qty 1

## 2013-11-07 MED ORDER — NITROGLYCERIN 0.4 MG SL SUBL
0.4000 mg | SUBLINGUAL_TABLET | SUBLINGUAL | Status: DC | PRN
  Administered 2013-11-07 (×2): 0.4 mg via SUBLINGUAL
  Filled 2013-11-07: qty 1

## 2013-11-07 MED ORDER — SODIUM CHLORIDE 0.9 % IV SOLN: 250.0000 mL | INTRAVENOUS | Status: DC | PRN

## 2013-11-07 MED ORDER — NITROGLYCERIN 2 % TD OINT
0.5000 [in_us] | TOPICAL_OINTMENT | Freq: Four times a day (QID) | TRANSDERMAL | Status: DC
  Administered 2013-11-08 (×2): 0.5 [in_us] via TOPICAL
  Filled 2013-11-07 (×5): qty 30

## 2013-11-07 MED ORDER — INSULIN ASPART 100 UNIT/ML ~~LOC~~ SOLN
0.0000 [IU] | Freq: Every day | SUBCUTANEOUS | Status: DC
  Administered 2013-11-07: 4 [IU] via SUBCUTANEOUS

## 2013-11-07 MED ORDER — ACETAMINOPHEN 325 MG PO TABS
650.0000 mg | ORAL_TABLET | ORAL | Status: DC | PRN
  Administered 2013-11-08: 650 mg via ORAL
  Filled 2013-11-07: qty 2

## 2013-11-07 MED ORDER — HEPARIN SODIUM (PORCINE) 5000 UNIT/ML IJ SOLN
5000.0000 [IU] | Freq: Three times a day (TID) | INTRAMUSCULAR | Status: DC
  Administered 2013-11-07 – 2013-11-08 (×2): 5000 [IU] via SUBCUTANEOUS
  Filled 2013-11-07 (×5): qty 1

## 2013-11-07 MED ORDER — SODIUM CHLORIDE 0.9 % IJ SOLN: 3.0000 mL | INTRAMUSCULAR | Status: DC | PRN

## 2013-11-07 NOTE — ED Notes (Signed)
Cardiology at bedside.

## 2013-11-07 NOTE — H&P (Signed)
2        Charles Blevins is an 58 y.o. male.    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  He has hx of diabetes insulin dependent but no prior cardiac hx.  Remote treadmill.  one episode of chest pain seen in ER in [place] but did not follow up.  No cardiac cath.  Previous HTN but no longer treated.  Over this past weekend he had nausea, vomiting and diarrhea.  He was unable to eat.  He has some stabbing chest pain.  Monday he felt better and returned to work Tuesday.  Today he had some vibration in his chest like his cell phone was vibrating.  Then he had stabbing chest pain, lt ant wall. He fell over a park bench, he denies passing out.  No diaphoresis, or nausea today.    Pain currently comes and goes as does vibration.   EKG with abnormal r wave progression, SR Troponin negative.  Glucose elevated.    Past Medical History  Diagnosis Date  . Back pain   . Diabetes mellitus without complication   . Fibromyalgia   . Seizures   . Arthritis, bil knees with chronic pain 11/07/2013    Past Surgical History  Procedure Laterality Date  . Knee surgery      Family History  Problem Relation Age of Onset  . Diabetes Mother   . Leukemia Father 31  . Healthy Sister   . Healthy Brother   . Healthy Sister   . Healthy Brother   . Healthy Brother   . Healthy Brother    Social History:  reports that he has been smoking Cigarettes.  He has been smoking about 1.50 packs per day. He has never used smokeless tobacco. He reports that he does not drink alcohol or use illicit drugs.  Allergies: No Known Allergies  OUTPATIENT MEDICATIONS: Metformin 1000 mg BID Insulin 30 units every AM, unsure the type ASA prn lyrica 300 mg TID  Results for orders placed during the hospital encounter of 11/07/13 (from the past 48 hour(s))  CBC     Status: None   Collection Time   11/07/13  3:03 PM      Result Value Ref Range   WBC 6.9  4.0 - 10.5 K/uL   RBC 5.18  4.22 - 5.81 MIL/uL   Hemoglobin 15.7  13.0 - 17.0 g/dL   HCT 44.2  39.0 - 52.0 %   MCV 85.3  78.0 - 100.0 fL   MCH 30.3  26.0 - 34.0 pg   MCHC 35.5  30.0 - 36.0 g/dL   RDW 13.6  11.5 - 15.5 %   Platelets 205  150 - 400 K/uL  BASIC METABOLIC PANEL     Status: Abnormal   Collection Time    11/07/13  3:03 PM      Result Value Ref Range   Sodium 135 (*) 137 - 147 mEq/L   Potassium 4.4  3.7 - 5.3 mEq/L   Chloride 100  96 - 112 mEq/L   CO2 22  19 - 32 mEq/L   Glucose, Bld 311 (*) 70 - 99 mg/dL   BUN 14  6 - 23 mg/dL   Creatinine, Ser 0.54  0.50 - 1.35 mg/dL   Calcium 9.4  8.4 - 10.5 mg/dL   GFR calc non Af Amer >90  >90 mL/min  GFR calc Af Amer >90  >90 mL/min   Comment: (NOTE)     The eGFR has been calculated using the CKD EPI equation.     This calculation has not been validated in all clinical situations.     eGFR's persistently <90 mL/min signify possible Chronic Kidney     Disease.  Randolm Idol, ED     Status: None   Collection Time    11/07/13  3:12 PM      Result Value Ref Range   Troponin i, poc 0.00  0.00 - 0.08 ng/mL   Comment 3            Comment: Due to the release kinetics of cTnI,     a negative result within the first hours     of the onset of symptoms does not rule out     myocardial infarction with certainty.     If myocardial infarction is still suspected,     repeat the test at appropriate intervals.   Dg Chest Port 1 View  11/07/2013   CLINICAL DATA:  Left-sided chest pain  EXAM: PORTABLE CHEST - 1 VIEW  COMPARISON:  None.  FINDINGS: The heart size and mediastinal contours are within normal limits. Both lungs are clear. The visualized skeletal structures are unremarkable.  IMPRESSION: No active disease.   Electronically Signed   By: Inez Catalina M.D.   On: 11/07/2013 15:10    ROS: General:no colds or fevers, GI bug with N, V, Diarrhea this weekend no weight  changes Skin:no rashes or ulcers HEENT:no blurred vision, no congestion CV:see HPI PUL:see HPI GI:no diarrhea since Sunday, no constipation or melena, no indigestion GU:no hematuria, no dysuria MS:++++ joint pain knees bil, no claudication Neuro:no syncope, no lightheadedness Endo:++ diabetes, no thyroid disease   Blood pressure 114/82, pulse 72, resp. rate 17, SpO2 91.00%. PE: General:Pleasant affect, NAD Skin:Warm and dry, brisk capillary refill HEENT:normocephalic, sclera clear, mucus membranes moist Neck:supple, no JVD, no bruits, no adenopathy  Heart:S1S2 RRR without murmur, gallup, rub or click Lungs:clear without rales, rhonchi, or wheezes ONG:EXBMW, soft, non tender, + BS, do not palpate liver spleen or masses Ext:no lower ext edema, 1+ pedal pulses, 2+ radial pulses Neuro:alert and oriented, MAE, follows commands, + facial symmetry    Assessment/Plan Principal Problem:   Chest pain, multiple risk factors for CAD with tobacco use, diabetes and hx HTN, though the pain is atypical -to OBs to rule out MI, monitor for arrythmia check Echo and nuc study in pt vs outpt unles enzyme pos. Check lipids Active Problems:   DM type 2 (diabetes mellitus, type 2) SSI in house, check A1c   Fibromyalgia, continue lyrica   Arthritis, bil knees with chronic pain   [facility] R Nurse Practitioner Certified [place] Pager [phone] or after 5pm or weekends call [phone] 11/07/2013, 6:03 PM   I have seen and examined the patient along with Cecilie Kicks, NP.  I have reviewed the chart, notes and new data.  I agree with NP's note.  Key new complaints: chest discomfort at rest relieved by SL NTG Key examination changes: no CHF, no arrhythmia on exam Key new findings / data: POC trop normal, ECG not high risk  Concerned re: possible unstable angina and ischemia induced ventricular arrhythmia.  PLAN: Keep on monitor overnight Lexiscan Myoview. Refer to  endocrinologist after DC  Sanda Klein, MD, [place] [phone] 11/07/2013, 6:42 PM

## 2013-11-07 NOTE — ED Provider Notes (Signed)
CSN: [id number]     Arrival date & time 11/07/13  1419 History   First MD Initiated Contact with Patient 11/07/13 1435     Chief Complaint  Patient presents with  . Chest Pain     (Consider location/radiation/quality/duration/timing/severity/associated sxs/prior Treatment) HPI Comments: Patient is 58 year old male with a past medical history of CAD, diabetes, and is a current smoker who presents with chest pain that started today. Patient reports having an intermittent vibration sensation in his left chest all weekend with occasional sharp chest pains. Today, patient reports acute worsening of the sharp chest pain in his left chest that radiates down his left arm. He reports associated left arm tingling and numbness, diaphoresis, SOB, and dizziness. The symptoms lasted "few minutes" and resolved but he continues to have this vibration sensation in his left chest. Patient reports recently moving to [place] from [place], [place] and was told he had a "bad artery" and was instructed to follow up with a Cardiologist which he did not do. Patient was given aspiring and nitro in the ambulance.   Patient is a 58 y.o. male presenting with chest pain.  Chest Pain Associated symptoms: diaphoresis and shortness of breath   Associated symptoms: no abdominal pain, no dizziness, no dysphagia, no fatigue, no fever, no nausea, no palpitations, not vomiting and no weakness     Past Medical History  Diagnosis Date  . Back pain   . Diabetes mellitus without complication   . Fibromyalgia   . Seizures    Past Surgical History  Procedure Laterality Date  . Knee surgery     No family history on file. History  Substance Use Topics  . Smoking status: Current Every Day Smoker -- 1.00 packs/day    Types: Cigarettes  . Smokeless tobacco: Not on file  . Alcohol Use: No    Review of Systems  Constitutional: Positive for diaphoresis. Negative for fever, chills and fatigue.  HENT: Negative for trouble  swallowing.   Eyes: Negative for visual disturbance.  Respiratory: Positive for shortness of breath.   Cardiovascular: Positive for chest pain. Negative for palpitations.  Gastrointestinal: Negative for nausea, vomiting, abdominal pain and diarrhea.  Genitourinary: Negative for dysuria and difficulty urinating.  Musculoskeletal: Negative for arthralgias and neck pain.  Skin: Negative for color change.  Neurological: Negative for dizziness and weakness.  Psychiatric/Behavioral: Negative for dysphoric mood.      Allergies  Review of patient's allergies indicates no known allergies.  Home Medications   Current Outpatient Rx  Name  Route  Sig  Dispense  Refill  . amitriptyline (ELAVIL) 10 MG tablet   Oral   Take 10 mg by mouth at bedtime as needed for sleep.         Marland Kitchen ibuprofen (ADVIL,MOTRIN) 200 MG tablet   Oral   Take 200-800 mg by mouth every 6 (six) hours as needed for mild pain or moderate pain.         Marland Kitchen insulin aspart (NOVOLOG) 100 UNIT/ML injection   Subcutaneous   Inject 10-16 Units into the skin 3 (three) times daily before meals.          . insulin glargine (LANTUS) 100 UNIT/ML injection   Subcutaneous   Inject 30 Units into the skin at bedtime.         . metFORMIN (GLUCOPHAGE) 1000 MG tablet   Oral   Take 1,000 mg by mouth 2 (two) times daily with a meal.         .  pregabalin (LYRICA) 300 MG capsule   Oral   Take 300 mg by mouth 3 (three) times daily.          BP 114/82  Pulse 72  Resp 17  SpO2 91% Physical Exam  Nursing note and vitals reviewed. Constitutional: He is oriented to person, place, and time. He appears well-developed and well-nourished. No distress.  HENT:  Head: Normocephalic and atraumatic.  Eyes: Conjunctivae and EOM are normal.  Neck: Normal range of motion.  Cardiovascular: Normal rate and regular rhythm.  Exam reveals no gallop and no friction rub.   No murmur heard. Pulmonary/Chest: Effort normal and breath sounds  normal. He has no wheezes. He has no rales. He exhibits no tenderness.  Abdominal: Soft. He exhibits no distension. There is no tenderness. There is no rebound.  Musculoskeletal: Normal range of motion.  Neurological: He is alert and oriented to person, place, and time.  Speech is goal-oriented. Moves limbs without ataxia.   Skin: Skin is warm and dry.  Psychiatric: He has a normal mood and affect. His behavior is normal.    ED Course  Procedures (including critical care time) Labs Review Labs Reviewed  CBC  BASIC METABOLIC PANEL  Rosezena SensorI-STAT TROPOININ, ED   Imaging Review Dg Chest Port 1 View  11/07/2013   CLINICAL DATA:  Left-sided chest pain  EXAM: PORTABLE CHEST - 1 VIEW  COMPARISON:  None.  FINDINGS: The heart size and mediastinal contours are within normal limits. Both lungs are clear. The visualized skeletal structures are unremarkable.  IMPRESSION: No active disease.   Electronically Signed   By: Alcide CleverMark  Lukens M.D.   On: 11/07/2013 15:10     EKG Interpretation   Date/Time:  Wednesday November 07 2013 14:27:20 EDT Ventricular Rate:  76 PR Interval:  171 QRS Duration: 91 QT Interval:  386 QTC Calculation: 434 R Axis:   0 Text Interpretation:  Sinus rhythm Low voltage, precordial leads Abnormal  R-wave progression, early transition Confirmed by HARRISON  MD, FORREST  ([id number]) on 11/07/2013 3:12:23 PM      MDM   Final diagnoses:  Chest pain    3:22 PM Patient's labs pending. Chest xray shows no acute changes. Patient's EKG shows abnormal R-wave progression in precordial leads. Patient has a cardiac history and will have Cardiology consult.   3:36 PM Patient signed out to Junious SilkHannah Merrell, PA-C.     Emilia BeckKaitlyn Jannelly Bergren, PA-C 11/08/13 1346

## 2013-11-07 NOTE — ED Provider Notes (Signed)
Patient signed out to me by Indiana University Healthzekalski, PA-C at change of shift. Currently labs pending. Consult cardiology given concerning story and cardiac history.   Patient remains hemodynamically stable. Patient wants to leave. Encouraged patient to stay as I feel he needs further evaluation. Patient is ultimately agreeable.  Cardiology to admit.   Results for orders placed during the hospital encounter of 11/07/13  CBC      Result Value Ref Range   WBC 6.9  4.0 - 10.5 K/uL   RBC 5.18  4.22 - 5.81 MIL/uL   Hemoglobin 15.7  13.0 - 17.0 g/dL   HCT 16.144.2  09.639.0 - 04.552.0 %   MCV 85.3  78.0 - 100.0 fL   MCH 30.3  26.0 - 34.0 pg   MCHC 35.5  30.0 - 36.0 g/dL   RDW 40.913.6  81.111.5 - 91.415.5 %   Platelets 205  150 - 400 K/uL  BASIC METABOLIC PANEL      Result Value Ref Range   Sodium 135 (*) 137 - 147 mEq/L   Potassium 4.4  3.7 - 5.3 mEq/L   Chloride 100  96 - 112 mEq/L   CO2 22  19 - 32 mEq/L   Glucose, Bld 311 (*) 70 - 99 mg/dL   BUN 14  6 - 23 mg/dL   Creatinine, Ser 7.820.54  0.50 - 1.35 mg/dL   Calcium 9.4  8.4 - 95.610.5 mg/dL   GFR calc non Af Amer >90  >90 mL/min   GFR calc Af Amer >90  >90 mL/min  I-STAT TROPOININ, ED      Result Value Ref Range   Troponin i, poc 0.00  0.00 - 0.08 ng/mL   Comment 3             Dg Chest Port 1 View  11/07/2013   CLINICAL DATA:  Left-sided chest pain  EXAM: PORTABLE CHEST - 1 VIEW  COMPARISON:  None.  FINDINGS: The heart size and mediastinal contours are within normal limits. Both lungs are clear. The visualized skeletal structures are unremarkable.  IMPRESSION: No active disease.   Electronically Signed   By: Alcide CleverMark  Lukens M.D.   On: 11/07/2013 15:10      Mora BellmanHannah S Maliq Pilley, PA-C 11/07/13 1831

## 2013-11-07 NOTE — ED Notes (Signed)
Pt arrives from his job via EMS for evaluation of cp that started over the weekend, pt states cp is intermittent and begins as a "sharp pain in left chest then changes to vibration and radiates to left arm." Pt states n/v/d all weekend as well, moved here recently from [place] and was told he had a "bad artery" but did not follow up. Pt does states dizziness, diaphoresis, sob with cp. EN route given 1 nitro with no relief, given 324 mg of Asprin as well. EKG unremarkable per EMS. Alert and oriented, nad noted.

## 2013-11-07 NOTE — ED Notes (Signed)
Pt reports that he would like to go home, he is tired of waiting. Dahlia ClientHannah, [place] made aware, this RN and Dahlia ClientHannah at bedside. Pt and family updated on the delay, pt agreed to stay and wait for cardiology decision on whether or not to admit pt.

## 2013-11-08 ENCOUNTER — Observation Stay (HOSPITAL_COMMUNITY): Payer: BC Managed Care – PPO

## 2013-11-08 ENCOUNTER — Other Ambulatory Visit: Payer: Self-pay

## 2013-11-08 DIAGNOSIS — R079 Chest pain, unspecified: Secondary | ICD-10-CM

## 2013-11-08 LAB — LIPID PANEL
CHOL/HDL RATIO: 4.9 ratio
CHOLESTEROL: 141 mg/dL (ref 0–200)
HDL: 29 mg/dL — AB (ref >39)
LDL Cholesterol: 76 mg/dL (ref 0–99)
TRIGLYCERIDES: 181 mg/dL — AB (ref <150)
VLDL: 36 mg/dL (ref 0–40)

## 2013-11-08 LAB — CBC
HCT: 41.8 % (ref 39.0–52.0)
Hemoglobin: 14.4 g/dL (ref 13.0–17.0)
MCH: 29.5 pg (ref 26.0–34.0)
MCHC: 34.4 g/dL (ref 30.0–36.0)
MCV: 85.7 fL (ref 78.0–100.0)
Platelets: 179 10*3/uL (ref 150–400)
RBC: 4.88 MIL/uL (ref 4.22–5.81)
RDW: 13.7 % (ref 11.5–15.5)
WBC: 6.5 10*3/uL (ref 4.0–10.5)

## 2013-11-08 LAB — GLUCOSE, CAPILLARY
Glucose-Capillary: 223 mg/dL — ABNORMAL HIGH (ref 70–99)
Glucose-Capillary: 272 mg/dL — ABNORMAL HIGH (ref 70–99)

## 2013-11-08 LAB — BASIC METABOLIC PANEL
BUN: 17 mg/dL (ref 6–23)
CO2: 26 mEq/L (ref 19–32)
CREATININE: 0.59 mg/dL (ref 0.50–1.35)
Calcium: 8.8 mg/dL (ref 8.4–10.5)
Chloride: 100 mEq/L (ref 96–112)
GFR calc Af Amer: >90 mL/min (ref >90)
Glucose, Bld: 268 mg/dL — ABNORMAL HIGH (ref 70–99)
Potassium: 4.1 mEq/L (ref 3.7–5.3)
SODIUM: 137 meq/L (ref 137–147)

## 2013-11-08 LAB — TROPONIN I: Troponin I: <0.3 ng/mL (ref <0.30)

## 2013-11-08 MED ORDER — ASPIRIN 81 MG PO TBEC: 81.0000 mg | DELAYED_RELEASE_TABLET | Freq: Every day | ORAL | Status: DC

## 2013-11-08 MED ORDER — ACETAMINOPHEN 325 MG PO TABS: 650.0000 mg | ORAL_TABLET | ORAL | Status: DC | PRN

## 2013-11-08 MED ORDER — REGADENOSON 0.4 MG/5ML IV SOLN
INTRAVENOUS | Status: AC
  Administered 2013-11-08: 0.4 mg via INTRAVENOUS
  Filled 2013-11-08: qty 5

## 2013-11-08 MED ORDER — MORPHINE SULFATE 4 MG/ML IJ SOLN
INTRAMUSCULAR | Status: DC
Start: 2013-11-08 — End: 2013-11-08
  Filled 2013-11-08: qty 1

## 2013-11-08 MED ORDER — TECHNETIUM TC 99M SESTAMIBI GENERIC - CARDIOLITE
10.0000 | Freq: Once | INTRAVENOUS | Status: AC | PRN
  Administered 2013-11-08: 10 via INTRAVENOUS

## 2013-11-08 MED ORDER — TECHNETIUM TC 99M SESTAMIBI GENERIC - CARDIOLITE
30.0000 | Freq: Once | INTRAVENOUS | Status: AC | PRN
  Administered 2013-11-08: 30 via INTRAVENOUS

## 2013-11-08 MED ORDER — TRAMADOL HCL 50 MG PO TABS: 50.0000 mg | ORAL_TABLET | Freq: Four times a day (QID) | ORAL | Status: DC | PRN

## 2013-11-08 MED ORDER — REGADENOSON 0.4 MG/5ML IV SOLN
0.4000 mg | Freq: Once | INTRAVENOUS | Status: AC
  Administered 2013-11-08: 0.4 mg via INTRAVENOUS

## 2013-11-08 MED ORDER — MAGNESIUM SULFATE 40 MG/ML IJ SOLN
2.0000 g | Freq: Once | INTRAMUSCULAR | Status: AC
  Administered 2013-11-08: 2 g via INTRAVENOUS
  Filled 2013-11-08: qty 50

## 2013-11-08 NOTE — Progress Notes (Signed)
Called by nurse for FYI that pt continued to have the same CP he presented with intermittently throughout the night, not particularly relieved with NTG, described as a soreness in the spot he is describing, lasting brief amount of time. EKG remains similar to yesterday and ruled out. The plan is to continue Cardiolite today. I also see Mg is 1.4 - will replete with 2g today. Dayna Dunn PA-C

## 2013-11-08 NOTE — Discharge Summary (Signed)
CARDIOLOGY DISCHARGE SUMMARY   Patient ID: Charles Blevins MRN: [id number] DOB/AGE: 60/09/1955 60 y.o.  Admit date: 11/07/2013 Discharge date: 11/08/2013  PCP: No local.  Primary Cardiologist: Croitoru  Primary Discharge Diagnosis:  Precordial chest pain Secondary Discharge Diagnosis:    DM type 2 (diabetes mellitus, type 2)   Fibromyalgia   Arthritis, bil knees with chronic pain    Procedures: Lexiscan nuclear stress test  Hospital Course: Charles Blevins is a 58 y.o. male with no history of CAD. He has untreated HTN, DM and recent GI illness. He had intermittent chest pain and came to the hospital where he was admitted for further evaluation and treatment.  He continued to have intermittent pain, but his cardiac enzymes were negative for MI and his ECG was not acute. He had a Lexiscan CL on 03/26, results are below.   His BP was not elevated. A lipid profile was checked, results below. His HDL is low and triglycerides were up slightly but LDL was 76.  His CBGs were high and an A1c was elevated as well, results below. He is on insulin and metformin. He is encouraged to f/u with primary care for further diabetes management and compliance with his diabetic diet and meds is encouraged.  He does not currently have a local physician and all of his prescriptions are from [place] previous [place] in [place]. He is encouraged to make an appointment with a primary care physician.  The results of his stress test were reviewed with Charles Blevins. He was upset that we have not been able to find a source for his pain. I reassured him that we had evaluated him for multiple life-threatening causes of chest pain and other causes of chest pain can be pursued as an outpatient. He was given a prescription for tramadol, 30 tablets with no refills because he said pain medications were helping with the pain. He was advised we would not prescribe anything stronger. He was given the phone number to [facility]  office at his request so he would have someone to call for a followup appointment. And a message was sent to the cardiology office to get him a followup appointment at his request.  On 03/26, Charles Blevins was seen by Dr. Excell Seltzerooper and all data were reviewed. No further inpatient workup was indicated and he is considered stable for discharge, to follow up as an outpatient.   Scheduled Meds: . aspirin  324 mg Oral NOW   Or  . aspirin  300 mg Rectal NOW  . aspirin EC  81 mg Oral Daily  . atorvastatin  40 mg Oral q1800  . heparin subcutaneous  5,000 Units Subcutaneous 3 times per day  . influenza vac split quadrivalent PF  0.5 mL Intramuscular Tomorrow-1000  . insulin aspart  0-5 Units Subcutaneous QHS  . insulin aspart  0-9 Units Subcutaneous TID WC  . insulin glargine  15 Units Subcutaneous QHS  . metoprolol tartrate  12.5 mg Oral BID  . morphine      . nitroGLYCERIN  0.5 inch Topical 4 times per day  . pneumococcal 23 valent vaccine  0.5 mL Intramuscular Tomorrow-1000  . pregabalin  300 mg Oral TID  . sodium chloride  3 mL Intravenous Q12H   Labs:  Lab Results  Component Value Date   WBC 6.5 11/08/2013   HGB 14.4 11/08/2013   HCT 41.8 11/08/2013   MCV 85.7 11/08/2013   PLT 179 11/08/2013     Recent Labs Lab  11/08/13 0540  NA 137  K 4.1  CL 100  CO2 26  BUN 17  CREATININE 0.59  CALCIUM 8.8  GLUCOSE 268*    Recent Labs  11/07/13 1847 11/08/13 0106 11/08/13 0540  TROPONINI <0.30 <0.30 <0.30   Lipid Panel     Component Value Date/Time   CHOL 141 11/08/2013 0540   TRIG 181* 11/08/2013 0540   HDL 29* 11/08/2013 0540   CHOLHDL 4.9 11/08/2013 0540   VLDL 36 11/08/2013 0540   LDLCALC 76 11/08/2013 0540    Pro B Natriuretic peptide (BNP)  Date/Time Value Ref Range Status  11/07/2013  6:47 PM 22.8  0 - 125 pg/mL Final    Recent Labs  11/07/13 1847  INR 0.92   Lab Results  Component Value Date   HGBA1C 10.9* 11/07/2013      Radiology: Dg Chest Port 1 View 11/07/2013    CLINICAL DATA:  Left-sided chest pain  EXAM: PORTABLE CHEST - 1 VIEW  COMPARISON:  None.  FINDINGS: The heart size and mediastinal contours are within normal limits. Both lungs are clear. The visualized skeletal structures are unremarkable.  IMPRESSION: No active disease.   Electronically Signed   By: Alcide Clever M.D.   On: 11/07/2013 15:10   Nm Myocar Multi W/spect W/wall Motion / Ef 11/08/2013   EXAM: MYOCARDIAL IMAGING WITH SPECT (REST AND PHARMACOLOGIC-STRESS)  GATED LEFT VENTRICULAR WALL MOTION STUDY  LEFT VENTRICULAR EJECTION FRACTION  TECHNIQUE: Standard myocardial SPECT imaging was performed after resting intravenous injection of 10 mCi Tc-3m sestamibi. Subsequently, intravenous infusion of Lexiscan was performed under the supervision of the Cardiology staff. At peak effect of the drug, 30 mCi Tc-64m sestamibi was injected intravenously and standard myocardial SPECT imaging was performed. Quantitative gated imaging was also performed to evaluate left ventricular wall motion, and estimate left ventricular ejection fraction.  COMPARISON:  None.  FINDINGS: The patient underwent Lexi scan Myoview stress testing under the supervision of the [facility] cardiologist staff. The resting EKG is normal. With stress there are no ischemic EKG changes. The patient tolerated the procedure without chest pain.  The quality of the images is good. Perfusion imaging shows no evidence of myocardial ischemia or scar.  The left ventricular end diastolic volume is 93 mL. The end Systolic volume is 28 mL. The left ventricular ejection fraction is 69%. Wall motion analysis shows no segmental wall motion abnormalities.  IMPRESSION: Normal 1 day Lexi scan Myoview stress test showing no evidence of ischemia or scar and showing normal left ventricular systolic function and no segmental wall motion abnormalities.   Electronically Signed   By: Cassell Clement M.D.   On: 11/08/2013 15:24   EKG: 11/08/2013 SR, no significant  abnormalities Vent. rate 70 BPM PR interval 144 ms QRS duration 84 ms QT/QTc 402/434 ms P-R-T axes -21 -22 -14  FOLLOW UP PLANS AND APPOINTMENTS No Known Allergies   Medication List         acetaminophen 325 MG tablet  Commonly known as:  TYLENOL  Take 2 tablets (650 mg total) by mouth every 4 (four) hours as needed for headache or mild pain.     amitriptyline 10 MG tablet  Commonly known as:  ELAVIL  Take 10 mg by mouth at bedtime as needed for sleep.     aspirin 81 MG EC tablet  Take 1 tablet (81 mg total) by mouth daily.     ibuprofen 200 MG tablet  Commonly known as:  ADVIL,MOTRIN  Take 200-800  mg by mouth every 6 (six) hours as needed for mild pain or moderate pain.     insulin aspart 100 UNIT/ML injection  Commonly known as:  novoLOG  Inject 10-16 Units into the skin 3 (three) times daily before meals.     insulin glargine 100 UNIT/ML injection  Commonly known as:  LANTUS  Inject 30 Units into the skin at bedtime.     metFORMIN 1000 MG tablet  Commonly known as:  GLUCOPHAGE  Take 1,000 mg by mouth 2 (two) times daily with a meal.     pregabalin 300 MG capsule  Commonly known as:  LYRICA  Take 300 mg by mouth 3 (three) times daily.     traMADol 50 MG tablet  Commonly known as:  ULTRAM  Take 1-2 tablets (50-100 mg total) by mouth every 6 (six) hours as needed.        Discharge Orders   Future Orders Complete By Expires   Diet - low sodium heart healthy  As directed    Diet Carb Modified  As directed    Increase activity slowly  As directed      Follow-up Information   Follow up with [facility] (The office will call.)    Contact information:   [address] [place] [address]       Schedule an appointment as soon as possible for a visit with [facility].   Contact information:   [address] [place] [address]       BRING ALL MEDICATIONS WITH YOU TO FOLLOW UP APPOINTMENTS  Time  spent with patient to include physician time: 42 min Signed: Theodore Demark, PA-C 11/08/2013, 5:50 PM Co-Sign MD

## 2013-11-08 NOTE — ED Provider Notes (Signed)
Medical screening examination/treatment/procedure(s) were performed by non-physician practitioner and as supervising physician I was immediately available for consultation/collaboration.   EKG Interpretation   Date/Time:  Wednesday November 07 2013 14:27:20 EDT Ventricular Rate:  76 PR Interval:  171 QRS Duration: 91 QT Interval:  386 QTC Calculation: 434 R Axis:   0 Text Interpretation:  Sinus rhythm Low voltage, precordial leads Abnormal  R-wave progression, early transition Confirmed by Alisha Burgo  MD, Deshannon Seide  ([id number]) on 11/07/2013 3:12:23 PM        Randa SpikeForrest Mort SawyersS Jahlil Ziller, MD 11/08/13 [phone]

## 2013-11-08 NOTE — Progress Notes (Signed)
Inpatient Diabetes Program Recommendations  AACE/ADA: New Consensus Statement on Inpatient Glycemic Control (2013)  Target Ranges:  Prepandial:   less than 140 mg/dL      Peak postprandial:   less than 180 mg/dL (1-2 hours)      Critically ill patients:  140 - 180 mg/dL   Results for Charles Blevins, Charles Blevins (MRN [id number]) as of 11/08/2013 13:46  Ref. Range 11/07/2013 19:39 11/08/2013 12:05  Glucose-Capillary Latest Range: 70-99 mg/dL 409338 (H) 811223 (H)   Diabetes history: DM2 Outpatient Diabetes medications: Lantus 30 units QHS, Metformin 1000 mg BID, Novolog 10-16 units TID with meals Current orders for Inpatient glycemic control: Lantus 15 units QHS, Novolog 0-9 units AC, Novolog 0-5 units HS  Inpatient Diabetes Program Recommendations Insulin - Basal: Please consider increasing Lantus to 20 units QHS. Correction (SSI): Please increase Novolog correction to moderate scale.   Thanks, Orlando PennerMarie Jaxsin Bottomley, RN, MSN, CCRN Diabetes Coordinator Inpatient Diabetes Program [phone] (Team Pager) [phone] (AP office) [phone] [facility] office)

## 2013-11-08 NOTE — Progress Notes (Signed)
Pt provided with dc instructions and education. Pt verbalized undersatnding. Pt has no questions at this time. IV removed with tip intact. Heart monitor cleaned and returned to front. Levonne Spillerhasidy Burnie Therien, RN

## 2013-11-08 NOTE — Progress Notes (Signed)
Lexiscan CL performed.  SBP 90s before and after test, higher during the test. Pt asymptomatic when SBP 90s.

## 2013-11-08 NOTE — Progress Notes (Signed)
UR completed 

## 2013-11-08 NOTE — Progress Notes (Signed)
    Subjective:  Chest pain feels like "cell phone vibrating in chest" and with episodic sharp pains. Hasn't slept well. Poor appetite over 2-3 days.  Objective:  Vital Signs in the last 24 hours: Temp:  [98.3 F (36.8 C)] 98.3 F (36.8 C) (03/26 0500) Pulse Rate:  [72-85] 74 (03/26 0500) Resp:  [17-20] 20 (03/26 0500) BP: (107-117)/(61-87) 109/74 mmHg (03/26 0500) SpO2:  [91 %-97 %] 95 % (03/26 0500) Weight:  [103.738 kg (228 lb 11.2 oz)-104.327 kg (230 lb)] 103.738 kg (228 lb 11.2 oz) (03/25 1934)  Intake/Output from previous day:    Physical Exam: Pt is alert and oriented, obese male in NAD HEENT: normal Neck: JVP - normal Lungs: CTA bilaterally CV: RRR without murmur or gallop Abd: soft, NT, Positive BS, obese Ext: no C/C/E, distal pulses intact and equal Skin: warm/dry no rash   Lab Results:  Recent Labs  11/07/13 1503 11/08/13 0540  WBC 6.9 6.5  HGB 15.7 14.4  PLT 205 179    Recent Labs  11/07/13 1503 11/08/13 0540  NA 135* 137  K 4.4 4.1  CL 100 100  CO2 22 26  GLUCOSE 311* 268*  BUN 14 17  CREATININE 0.54 0.59    Recent Labs  11/08/13 0106 11/08/13 0540  TROPONINI <0.30 <0.30    Cardiac Studies: Myoview pending  Assessment/Plan:  Atypical chest pain but multiple CV risk factors as noted (DM, obesity, tobacco, HTN  Plan: Myoview this am for risk stratification. Continue ASA, metoprolol, lipitor. If Myoview normal/low-risk, plan on d/c home this afternoon.  Tonny BollmanMichael Ellamarie Naeve, M.D. 11/08/2013, 7:52 AM

## 2013-11-11 NOTE — ED Provider Notes (Signed)
Medical screening examination/treatment/procedure(s) were performed by non-physician practitioner and as supervising physician I was immediately available for consultation/collaboration.   EKG Interpretation   Date/Time:  Wednesday November 07 2013 14:27:20 EDT Ventricular Rate:  76 PR Interval:  171 QRS Duration: 91 QT Interval:  386 QTC Calculation: 434 R Axis:   0 Text Interpretation:  Sinus rhythm Low voltage, precordial leads Abnormal  R-wave progression, early transition Confirmed by HARRISON  MD, FORREST  ([id number]) on 11/07/2013 3:12:23 PM       Flint MelterElliott L Tanaysia Bhardwaj, MD 11/11/13 2132

## 2014-02-10 ENCOUNTER — Encounter (HOSPITAL_COMMUNITY): Payer: Self-pay | Admitting: Emergency Medicine

## 2014-02-10 ENCOUNTER — Emergency Department (HOSPITAL_COMMUNITY)
Admission: EM | Admit: 2014-02-10 | Discharge: 2014-02-10 | Disposition: A | Discharge status: Home / Self Care | Payer: BC Managed Care – PPO | Attending: Emergency Medicine | Admitting: Emergency Medicine

## 2014-02-10 ENCOUNTER — Emergency Department (HOSPITAL_COMMUNITY): Payer: BC Managed Care – PPO

## 2014-02-10 DIAGNOSIS — T148XXA Other injury of unspecified body region, initial encounter: Secondary | ICD-10-CM

## 2014-02-10 DIAGNOSIS — F17200 Nicotine dependence, unspecified, uncomplicated: Secondary | ICD-10-CM | POA: Insufficient documentation

## 2014-02-10 DIAGNOSIS — G8929 Other chronic pain: Secondary | ICD-10-CM | POA: Insufficient documentation

## 2014-02-10 DIAGNOSIS — IMO0001 Reserved for inherently not codable concepts without codable children: Secondary | ICD-10-CM | POA: Insufficient documentation

## 2014-02-10 DIAGNOSIS — Z794 Long term (current) use of insulin: Secondary | ICD-10-CM | POA: Insufficient documentation

## 2014-02-10 DIAGNOSIS — E119 Type 2 diabetes mellitus without complications: Secondary | ICD-10-CM | POA: Insufficient documentation

## 2014-02-10 DIAGNOSIS — S93409A Sprain of unspecified ligament of unspecified ankle, initial encounter: Secondary | ICD-10-CM | POA: Insufficient documentation

## 2014-02-10 DIAGNOSIS — Z79899 Other long term (current) drug therapy: Secondary | ICD-10-CM | POA: Insufficient documentation

## 2014-02-10 DIAGNOSIS — S5010XA Contusion of unspecified forearm, initial encounter: Secondary | ICD-10-CM | POA: Insufficient documentation

## 2014-02-10 DIAGNOSIS — Y9289 Other specified places as the place of occurrence of the external cause: Secondary | ICD-10-CM | POA: Insufficient documentation

## 2014-02-10 DIAGNOSIS — Y9389 Activity, other specified: Secondary | ICD-10-CM | POA: Insufficient documentation

## 2014-02-10 DIAGNOSIS — Z9889 Other specified postprocedural states: Secondary | ICD-10-CM | POA: Insufficient documentation

## 2014-02-10 DIAGNOSIS — S93402A Sprain of unspecified ligament of left ankle, initial encounter: Secondary | ICD-10-CM

## 2014-02-10 DIAGNOSIS — M129 Arthropathy, unspecified: Secondary | ICD-10-CM | POA: Insufficient documentation

## 2014-02-10 DIAGNOSIS — W108XXA Fall (on) (from) other stairs and steps, initial encounter: Secondary | ICD-10-CM | POA: Insufficient documentation

## 2014-02-10 DIAGNOSIS — G40909 Epilepsy, unspecified, not intractable, without status epilepticus: Secondary | ICD-10-CM | POA: Insufficient documentation

## 2014-02-10 MED ORDER — OXYCODONE-ACETAMINOPHEN 5-325 MG PO TABS
2.0000 | ORAL_TABLET | Freq: Once | ORAL | Status: AC
  Administered 2014-02-10: 2 via ORAL
  Filled 2014-02-10: qty 2

## 2014-02-10 MED ORDER — HYDROCODONE-ACETAMINOPHEN 5-325 MG PO TABS
2.0000 | ORAL_TABLET | Freq: Once | ORAL | Status: DC
  Filled 2014-02-10: qty 2

## 2014-02-10 MED ORDER — TRAMADOL HCL 50 MG PO TABS: 50.0000 mg | ORAL_TABLET | Freq: Four times a day (QID) | ORAL | Status: DC | PRN

## 2014-02-10 NOTE — ED Notes (Signed)
The pt fell yesterday and he is having pain in his entire lt side

## 2014-02-10 NOTE — ED Notes (Signed)
Pt refused Hydrocodone.  States it causes vomiting.  States he has took Percocet yesterday and Ibuprofen with no relief.

## 2014-02-10 NOTE — Discharge Instructions (Signed)
Please read and follow all provided instructions.  Your diagnoses today include:  1. Contusion   2. Ankle sprain, left, initial encounter     Tests performed today include:  An x-ray of affected areas - do NOT show any broken bones  Vital signs. See below for your results today.   Medications prescribed:   Tramadol - narcotic-like pain medication  DO NOT drive or perform any activities that require you to be awake and alert because this medicine can make you drowsy.   Take any prescribed medications only as directed.  Home care instructions:   Follow any educational materials contained in this packet  Follow R.I.C.E. Protocol:  R - rest your injury   I  - use ice on injury without applying directly to skin  C - compress injury with bandage or splint  E - elevate the injury as much as possible  Follow-up instructions: Please follow-up with your primary care provider if you continue to have significant pain or trouble walking in 1 week. In this case you may have a severe sprain that requires further care.   Return instructions:   Please return if your toes are numb or tingling, appear gray or blue, or you have severe pain (also elevate leg and loosen splint or wrap)  Please return to the Emergency Department if you experience worsening symptoms.   Please return if you have any other emergent concerns.  Additional Information:  Your vital signs today were: BP 143/87   Pulse 89   Temp(Src) 98.2 F (36.8 C) (Oral)   Resp 22   SpO2 96% If your blood pressure (BP) was elevated above 135/85 this visit, please have this repeated by your doctor within one month. -------------- Your caregiver has diagnosed you as suffering from an ankle sprain. Ankle sprain occurs when the ligaments that hold the ankle joint together are stretched or torn. It may take 4 to 6 weeks to heal. --------------

## 2014-02-10 NOTE — ED Provider Notes (Signed)
CSN: [id number]     Arrival date & time 02/10/14  1616 History  This chart was scribed for non-physician practitioner, Renne CriglerJoshua Geiple, PA-C, working with Shon Batonourtney F Horton, MD by Shari HeritageAisha Amuda, ED Scribe. This patient was seen in room TR05C/TR05C and the patient's care was started at 5:13 PM.     Chief Complaint  Patient presents with  . Fall    The history is provided by the patient. No language interpreter was used.    HPI Comments: Charles Blevins is a 58 y.o. male with history of DM who presents to the Emergency Department complaining of a fall that occurred yesterday at about 5 PM. Patient states that he tripped and fell down about 20 stairs. He says that he slid for at first, then he started tumbling as he descended the steps. He states that he hit the right side of his face during the fall, but he denies loss of consciousness after the fall. Currently, he complains of constant left ankle pain with associated swelling - pain is worse with weight bearing. He also has left upper arm, left elbow pain, and left shoulder pain. Patient has a history of chronic knee pain in both knees and states that fall has exacerbated his knee pain especially on the left. He sees an orthopedist who is trying other treatments for his knee pain before pursuing surgery. Patient has taken has taken two tablets of Percocet since the fall without relief. He has also taken ibuprofen with no improvement. He denies abdominal pain, hip pain, leg pain, head pain, neck pain, numbness or weakness of the extremities, chest pain or shortness of breath.    Past Medical History  Diagnosis Date  . Back pain   . Diabetes mellitus without complication   . Fibromyalgia   . Seizures   . Arthritis, bil knees with chronic pain 11/07/2013   Past Surgical History  Procedure Laterality Date  . Knee surgery     Family History  Problem Relation Age of Onset  . Diabetes Mother   . Leukemia Father 2249  . Healthy Sister   . Healthy Brother    . Healthy Sister   . Healthy Brother   . Healthy Brother   . Healthy Brother    History  Substance Use Topics  . Smoking status: Current Every Day Smoker -- 1.50 packs/day    Types: Cigarettes  . Smokeless tobacco: Never Used  . Alcohol Use: No    Review of Systems  Constitutional: Negative for fever, chills, activity change and fatigue.  HENT: Negative for tinnitus.   Eyes: Negative for photophobia, pain and visual disturbance.  Respiratory: Negative for shortness of breath.   Cardiovascular: Negative for chest pain.  Gastrointestinal: Negative for nausea, vomiting and abdominal pain.  Musculoskeletal: Positive for arthralgias (left knee pain, left upper extremity pain, left ankle pain), gait problem and joint swelling. Negative for back pain and neck pain.  Skin: Negative for wound.  Neurological: Negative for dizziness, syncope, weakness, light-headedness, numbness and headaches.  Psychiatric/Behavioral: Negative for confusion and decreased concentration.    Allergies  Review of patient's allergies indicates no known allergies.  Home Medications   Prior to Admission medications   Medication Sig Start Date End Date Taking? Authorizing Provider  acetaminophen (TYLENOL) 325 MG tablet Take 2 tablets (650 mg total) by mouth every 4 (four) hours as needed for headache or mild pain. 11/08/13   Rhonda G Barrett, PA-C  amitriptyline (ELAVIL) 10 MG tablet Take 10 mg by mouth  at bedtime as needed for sleep.    Historical Provider, MD  aspirin EC 81 MG EC tablet Take 1 tablet (81 mg total) by mouth daily. 11/08/13   Rhonda G Barrett, PA-C  ibuprofen (ADVIL,MOTRIN) 200 MG tablet Take 200-800 mg by mouth every 6 (six) hours as needed for mild pain or moderate pain.    Historical Provider, MD  insulin aspart (NOVOLOG) 100 UNIT/ML injection Inject 10-16 Units into the skin 3 (three) times daily before meals.     Historical Provider, MD  insulin glargine (LANTUS) 100 UNIT/ML injection Inject  30 Units into the skin at bedtime.    Historical Provider, MD  metFORMIN (GLUCOPHAGE) 1000 MG tablet Take 1,000 mg by mouth 2 (two) times daily with a meal.    Historical Provider, MD  pregabalin (LYRICA) 300 MG capsule Take 300 mg by mouth 3 (three) times daily.    Historical Provider, MD  traMADol (ULTRAM) 50 MG tablet Take 1-2 tablets (50-100 mg total) by mouth every 6 (six) hours as needed. 11/08/13   Joline Salthonda G Barrett, PA-C   Triage Vitals: BP 143/87  Pulse 89  Temp(Src) 98.2 F (36.8 C) (Oral)  Resp 22  SpO2 96% Physical Exam  Nursing note and vitals reviewed. Constitutional: He is oriented to person, place, and time. He appears well-developed and well-nourished. No distress.  HENT:  Head: Normocephalic and atraumatic. Head is without raccoon's eyes and without Battle's sign.  Right Ear: Tympanic membrane, external ear and ear canal normal. No hemotympanum.  Left Ear: Tympanic membrane, external ear and ear canal normal. No hemotympanum.  Nose: Nose normal. No nasal septal hematoma.  Mouth/Throat: Oropharynx is clear and moist.  Eyes: Conjunctivae, EOM and lids are normal. Pupils are equal, round, and reactive to light.  No visible hyphema  Neck: Normal range of motion. Neck supple. No tracheal deviation present.  Cardiovascular: Normal rate and regular rhythm.   Pulmonary/Chest: Effort normal and breath sounds normal. No respiratory distress.  Abdominal: Soft. There is no tenderness.  Musculoskeletal:       Right shoulder: Normal.       Left shoulder: Normal.       Left elbow: He exhibits decreased range of motion and swelling (mild). Tenderness (generalized) found.       Left wrist: Normal.       Right hip: Normal.       Left hip: Normal.       Left knee: He exhibits decreased range of motion. He exhibits no swelling. Tenderness found. Medial joint line and lateral joint line tenderness noted.       Left ankle: He exhibits decreased range of motion and swelling. Tenderness.  Lateral malleolus tenderness found. No head of 5th metatarsal and no proximal fibula tenderness found.       Cervical back: He exhibits normal range of motion, no tenderness and no bony tenderness.       Thoracic back: He exhibits no tenderness and no bony tenderness.       Lumbar back: He exhibits no tenderness and no bony tenderness.       Left upper arm: Normal.       Left forearm: He exhibits tenderness. He exhibits no bony tenderness and no swelling.       Left hand: Normal.       Left upper leg: Normal.       Left lower leg: Normal.       Left foot: He exhibits normal range of motion and no  tenderness.  Neurological: He is alert and oriented to person, place, and time. He has normal strength and normal reflexes. No cranial nerve deficit or sensory deficit. Coordination normal. GCS eye subscore is 4. GCS verbal subscore is 5. GCS motor subscore is 6.  Skin: Skin is warm and dry.  Psychiatric: He has a normal mood and affect. His behavior is normal.    ED Course  Procedures (including critical care time) DIAGNOSTIC STUDIES: Oxygen Saturation is 96% on room air, adequate by my interpretation.    COORDINATION OF CARE: 5:18 PM- Patient informed of current plan for treatment and evaluation and agrees with plan at this time.  Patient Vitals for the past 24 hrs:  BP Temp Temp src Pulse Resp SpO2  02/10/14 1616 143/87 mmHg 98.2 F (36.8 C) Oral 89 22 96 %    Imaging Review Dg Elbow Complete Left  02/10/2014   CLINICAL DATA:  Elbow pain post fall yesterday  EXAM: LEFT ELBOW - COMPLETE 3+ VIEW  COMPARISON:  None.  FINDINGS: Four views of the left elbow submitted. No acute fracture or subluxation. Spurring of lateral humeral epicondyle. No posterior fat pad sign. Soft tissue swelling noted dorsal elbow.  IMPRESSION: No acute fracture or subluxation. Soft tissue swelling dorsal elbow.   Electronically Signed   By: Natasha Mead M.D.   On: 02/10/2014 18:50   Dg Forearm Left  02/10/2014    CLINICAL DATA:  Pain post fall yesterday  EXAM: LEFT FOREARM - 2 VIEW  COMPARISON:  None.  FINDINGS: Two views of the left forearm submitted. No acute fracture or subluxation. No radiopaque foreign body.  IMPRESSION: Negative.   Electronically Signed   By: Natasha Mead M.D.   On: 02/10/2014 18:51   Dg Ankle Complete Left  02/10/2014   CLINICAL DATA:  Pain post fall yesterday  EXAM: LEFT ANKLE COMPLETE - 3+ VIEW  COMPARISON:  None.  FINDINGS: Three views of the left ankle submitted. No acute fracture or subluxation. Ankle mortise is preserved. Plantar spurring of calcaneus.  IMPRESSION: No acute fracture or subluxation.  Plantar spurring of calcaneus.   Electronically Signed   By: Natasha Mead M.D.   On: 02/10/2014 18:53   Dg Knee Complete 4 Views Left  02/10/2014   CLINICAL DATA:  Pain post fall  EXAM: LEFT KNEE - COMPLETE 4+ VIEW  COMPARISON:  None.  FINDINGS: Four views of the left knee submitted. No acute fracture or subluxation. Mild narrowing of medial joint compartment. Mild spurring of medial femoral condyle and medial tibial plateau. No radiopaque foreign body.  IMPRESSION: No acute fracture or subluxation. Osteoarthritic changes as described above.   Electronically Signed   By: Natasha Mead M.D.   On: 02/10/2014 18:52   7:22 PM patient informed of x-ray results. He states that the Percocet did not really help. Will provide with sling, Ace wrap, ASO for ankle. Patient states that he will go home and take a warm bath to help his pain.  Urged primary care physician followup if not improved in several days. Patient counseled on rice protocol.  Patient urged to return with worsening symptoms or other concerns. Patient verbalized understanding and agrees with plan.     MDM   Final diagnoses:  Contusion  Ankle sprain, left, initial encounter   Patient with no signs of significant head or neck injury after fall yesterday. X-rays of painful areas are negative. Suspect sprain and contusions to these  areas. Upper and lower extremity is neurovascularly intact.  No significant back pain. Conservative management indicated.  I personally performed the services described in this documentation, which was scribed in my presence. The recorded information has been reviewed and is accurate.    Renne Crigler, PA-C 02/10/14 1924

## 2014-02-11 NOTE — ED Provider Notes (Signed)
Medical screening examination/treatment/procedure(s) were performed by non-physician practitioner and as supervising physician I was immediately available for consultation/collaboration.   EKG Interpretation None        Courtney F Horton, MD 02/11/14 1933 

## 2014-04-20 ENCOUNTER — Encounter (HOSPITAL_COMMUNITY): Payer: Self-pay | Admitting: Emergency Medicine

## 2014-04-20 ENCOUNTER — Emergency Department (HOSPITAL_COMMUNITY)
Admission: EM | Admit: 2014-04-20 | Discharge: 2014-04-20 | Disposition: A | Discharge status: Home / Self Care | Payer: BC Managed Care – PPO | Attending: Emergency Medicine | Admitting: Emergency Medicine

## 2014-04-20 DIAGNOSIS — M7989 Other specified soft tissue disorders: Secondary | ICD-10-CM | POA: Diagnosis not present

## 2014-04-20 DIAGNOSIS — IMO0002 Reserved for concepts with insufficient information to code with codable children: Secondary | ICD-10-CM

## 2014-04-20 DIAGNOSIS — M79609 Pain in unspecified limb: Secondary | ICD-10-CM | POA: Insufficient documentation

## 2014-04-20 DIAGNOSIS — F17200 Nicotine dependence, unspecified, uncomplicated: Secondary | ICD-10-CM | POA: Insufficient documentation

## 2014-04-20 DIAGNOSIS — M1710 Unilateral primary osteoarthritis, unspecified knee: Secondary | ICD-10-CM | POA: Diagnosis not present

## 2014-04-20 DIAGNOSIS — Z7982 Long term (current) use of aspirin: Secondary | ICD-10-CM | POA: Diagnosis not present

## 2014-04-20 DIAGNOSIS — IMO0001 Reserved for inherently not codable concepts without codable children: Secondary | ICD-10-CM | POA: Insufficient documentation

## 2014-04-20 DIAGNOSIS — G8929 Other chronic pain: Secondary | ICD-10-CM | POA: Insufficient documentation

## 2014-04-20 DIAGNOSIS — Z794 Long term (current) use of insulin: Secondary | ICD-10-CM | POA: Insufficient documentation

## 2014-04-20 DIAGNOSIS — Z79899 Other long term (current) drug therapy: Secondary | ICD-10-CM | POA: Diagnosis not present

## 2014-04-20 DIAGNOSIS — E119 Type 2 diabetes mellitus without complications: Secondary | ICD-10-CM | POA: Insufficient documentation

## 2014-04-20 LAB — COMPREHENSIVE METABOLIC PANEL
ALBUMIN: 3.1 g/dL — AB (ref 3.5–5.2)
ALT: 16 U/L (ref 0–53)
AST: 11 U/L (ref 0–37)
Alkaline Phosphatase: 73 U/L (ref 39–117)
Anion gap: 12 (ref 5–15)
BUN: 11 mg/dL (ref 6–23)
CALCIUM: 9.1 mg/dL (ref 8.4–10.5)
CHLORIDE: 97 meq/L (ref 96–112)
CO2: 26 mEq/L (ref 19–32)
CREATININE: 0.66 mg/dL (ref 0.50–1.35)
GFR calc Af Amer: >90 mL/min (ref >90)
GFR calc non Af Amer: >90 mL/min (ref >90)
Glucose, Bld: 215 mg/dL — ABNORMAL HIGH (ref 70–99)
Potassium: 3.6 mEq/L — ABNORMAL LOW (ref 3.7–5.3)
Sodium: 135 mEq/L — ABNORMAL LOW (ref 137–147)
Total Bilirubin: 0.3 mg/dL (ref 0.3–1.2)
Total Protein: 6.6 g/dL (ref 6.0–8.3)

## 2014-04-20 LAB — CBC
HCT: 38.5 % — ABNORMAL LOW (ref 39.0–52.0)
Hemoglobin: 13.1 g/dL (ref 13.0–17.0)
MCH: 28.9 pg (ref 26.0–34.0)
MCHC: 34 g/dL (ref 30.0–36.0)
MCV: 84.8 fL (ref 78.0–100.0)
Platelets: 141 10*3/uL — ABNORMAL LOW (ref 150–400)
RBC: 4.54 MIL/uL (ref 4.22–5.81)
RDW: 14.3 % (ref 11.5–15.5)
WBC: 7.5 10*3/uL (ref 4.0–10.5)

## 2014-04-20 LAB — CBG MONITORING, ED: Glucose-Capillary: 220 mg/dL — ABNORMAL HIGH (ref 70–99)

## 2014-04-20 MED ORDER — HYDROCODONE-ACETAMINOPHEN 5-325 MG PO TABS
2.0000 | ORAL_TABLET | Freq: Once | ORAL | Status: AC
  Administered 2014-04-20: 2 via ORAL
  Filled 2014-04-20: qty 2

## 2014-04-20 MED ORDER — HYDROCODONE-ACETAMINOPHEN 5-325 MG PO TABS: 1.0000 | ORAL_TABLET | ORAL | Status: DC | PRN

## 2014-04-20 MED ORDER — FUROSEMIDE 20 MG PO TABS: 20.0000 mg | ORAL_TABLET | Freq: Every day | ORAL | Status: DC

## 2014-04-20 NOTE — ED Notes (Signed)
Pt has bilateral lower extremity edema. His legs are painful to touch. Patient advised to elevate legs to help with swelling.

## 2014-04-20 NOTE — ED Notes (Addendum)
Pt arrived to the ED with a complaint of bilateral leg pain.  Pt is also complaining of bilateral pedal edema.  Pt has a hx of diabetes.  Pt injured the left foot a month ago which was diagnosed as a sprain.  Pt states the pedal edema started about a Thursday. Pt has a hx of diabetes.  CBG 220 in triage

## 2014-04-20 NOTE — ED Provider Notes (Signed)
CSN: [id number]     Arrival date & time 04/20/14  1704 History   First MD Initiated Contact with Patient 04/20/14 1723     Chief Complaint  Patient presents with  . Leg Pain     (Consider location/radiation/quality/duration/timing/severity/associated sxs/prior Treatment) HPI 58 year old male presents with lower extremity edema over the last 4 days. He states the swelling is equal bilaterally. No known injury to precipitate this. He states his legs feel tight and he has a severe, 10/10 pain associated with this. He states he chronically has numbness and tingling in his legs has not new or worse. No weakness. He states he injured his ankle 3 months ago was seen in the ED and told it was a sprain but is never seemed to heal quite well. He's been taking ibuprofen without relief. No fevers or chills. Not been ill recently. No chest pain, shortness of breath, or orthopnea. No history of liver disease or alcohol abuse. No abdominal pain or swelling.  Past Medical History  Diagnosis Date  . Back pain   . Diabetes mellitus without complication   . Fibromyalgia   . Seizures   . Arthritis, bil knees with chronic pain 11/07/2013   Past Surgical History  Procedure Laterality Date  . Knee surgery     Family History  Problem Relation Age of Onset  . Diabetes Mother   . Leukemia Father 30  . Healthy Sister   . Healthy Brother   . Healthy Sister   . Healthy Brother   . Healthy Brother   . Healthy Brother    History  Substance Use Topics  . Smoking status: Current Every Day Smoker -- 1.50 packs/day    Types: Cigarettes  . Smokeless tobacco: Never Used  . Alcohol Use: No    Review of Systems  Constitutional: Negative for fever.  Respiratory: Negative for shortness of breath.   Cardiovascular: Positive for leg swelling. Negative for chest pain.       No orthopnea  Gastrointestinal: Negative for abdominal pain and abdominal distention.  Neurological: Positive for numbness. Negative for  weakness.  All other systems reviewed and are negative.     Allergies  Review of patient's allergies indicates no known allergies.  Home Medications   Prior to Admission medications   Medication Sig Start Date End Date Taking? Authorizing Provider  acetaminophen (TYLENOL) 325 MG tablet Take 2 tablets (650 mg total) by mouth every 4 (four) hours as needed for headache or mild pain. 11/08/13  Yes Rhonda G Barrett, PA-C  amitriptyline (ELAVIL) 10 MG tablet Take 10 mg by mouth at bedtime as needed for sleep.   Yes Historical Provider, MD  aspirin EC 81 MG EC tablet Take 1 tablet (81 mg total) by mouth daily. 11/08/13  Yes Rhonda G Barrett, PA-C  ibuprofen (ADVIL,MOTRIN) 800 MG tablet Take 800 mg by mouth every 8 (eight) hours as needed for mild pain or moderate pain.   Yes Historical Provider, MD  insulin aspart (NOVOLOG) 100 UNIT/ML injection Inject 10 Units into the skin 3 (three) times daily before meals.    Yes Historical Provider, MD  insulin glargine (LANTUS) 100 UNIT/ML injection Inject 30 Units into the skin daily.    Yes Historical Provider, MD  metFORMIN (GLUCOPHAGE) 1000 MG tablet Take 1,000 mg by mouth 2 (two) times daily with a meal.   Yes Historical Provider, MD  pregabalin (LYRICA) 300 MG capsule Take 300 mg by mouth 3 (three) times daily.   Yes Historical Provider, MD  BP 114/69  Pulse 91  Temp(Src) 99 F (37.2 C) (Oral)  Resp 18  Ht  (1.702 m)  Wt 230 lb (104.327 kg)  BMI 36.01 kg/m2  SpO2 93% Physical Exam  Nursing note and vitals reviewed. Constitutional: He is oriented to person, place, and time. He appears well-developed and well-nourished.  HENT:  Head: Normocephalic and atraumatic.  Right Ear: External ear normal.  Left Ear: External ear normal.  Nose: Nose normal.  Eyes: Right eye exhibits no discharge. Left eye exhibits no discharge.  Neck: Neck supple.  Cardiovascular: Normal rate, regular rhythm, normal heart sounds and intact distal pulses.    Pulmonary/Chest: Effort normal and breath sounds normal. He has no rales.  Abdominal: Soft. He exhibits no distension. There is no tenderness.  Musculoskeletal: He exhibits edema (bilateral non-pitting edema from lower legs to feet. No erythema or cellulitis).  Multiple abrasions to right lower extremity - no evidence of infection. Patient states he had to go through a glass object which cut his leg up several days ago.  Neurological: He is alert and oriented to person, place, and time.  Skin: Skin is warm and dry.    ED Course  Procedures (including critical care time) Labs Review Labs Reviewed  CBC - Abnormal; Notable for the following:    HCT 38.5 (*)    Platelets 141 (*)    All other components within normal limits  COMPREHENSIVE METABOLIC PANEL - Abnormal; Notable for the following:    Sodium 135 (*)    Potassium 3.6 (*)    Glucose, Bld 215 (*)    Albumin 3.1 (*)    All other components within normal limits  CBG MONITORING, ED - Abnormal; Notable for the following:    Glucose-Capillary 220 (*)    All other components within normal limits    Imaging Review No results found.   EKG Interpretation None      MDM   Final diagnoses:  Leg swelling    No obvious cause for the patient's lower extremity edema. The patient has no asymmetric swelling to suggest DVT. No chest pain, shortness of breath, orthopnea, or cardiac history does suggest CHF. Renal function and liver function is normal. We'll give pain control and a small dose of Lasix for symptomatic care. Recommend that he followup with a PCP as may be a recurrent issue. Feel at this time patient is stable for discharge.    Audree Camel, MD 04/20/14 [phone]

## 2014-04-20 NOTE — Discharge Instructions (Signed)
Peripheral Edema °You have swelling in your legs (peripheral edema). This swelling is due to excess accumulation of salt and water in your body. Edema may be a sign of heart, kidney or liver disease, or a side effect of a medication. It may also be due to problems in the leg veins. Elevating your legs and using special support stockings may be very helpful, if the cause of the swelling is due to poor venous circulation. Avoid long periods of standing, whatever the cause. °Treatment of edema depends on identifying the cause. Chips, pretzels, pickles and other salty foods should be avoided. Restricting salt in your diet is almost always needed. Water pills (diuretics) are often used to remove the excess salt and water from your body via urine. These medicines prevent the kidney from reabsorbing sodium. This increases urine flow. °Diuretic treatment may also result in lowering of potassium levels in your body. Potassium supplements may be needed if you have to use diuretics daily. Daily weights can help you keep track of your progress in clearing your edema. You should call your caregiver for follow up care as recommended. °SEEK IMMEDIATE MEDICAL CARE IF:  °· You have increased swelling, pain, redness, or heat in your legs. °· You develop shortness of breath, especially when lying down. °· You develop chest or abdominal pain, weakness, or fainting. °· You have a fever. °Document Released: 09/09/2004 Document Revised: 10/25/2011 Document Reviewed: 08/20/2009 °ExitCare® Patient Information ©2015 [employer]. This information is not intended to replace advice given to you by your health care provider. Make sure you discuss any questions you have with your health care provider. ° ° °Emergency Department Resource Guide °1) Find a Doctor and Pay Out of Pocket °Although you won't have to find out who is covered by your insurance plan, it is a good idea to ask around and get recommendations. You will then need to call the  office and see if the doctor you have chosen will accept you as a new patient and what types of options they offer for patients who are self-pay. Some doctors offer discounts or will set up payment plans for their patients who do not have insurance, but you will need to ask so you aren't surprised when you get to your appointment. ° °2) Contact Your Local Health Department °Not all health departments have doctors that can see patients for sick visits, but many do, so it is worth a call to see if yours does. If you don't know where your local health department is, you can check in your phone book. The CDC also has a tool to help you locate your state's health department, and many state websites also have listings of all of their local health departments. ° °3) Find a Walk-in Clinic °If your illness is not likely to be very severe or complicated, you may want to try a walk in clinic. These are popping up all over the country in pharmacies, drugstores, and shopping centers. They're usually staffed by nurse practitioners or physician assistants that have been trained to treat common illnesses and complaints. They're usually fairly quick and inexpensive. However, if you have serious medical issues or chronic medical problems, these are probably not your best option. ° °No Primary Care Doctor: °- Call [facility] at  832-8000 - they can help you locate a primary care doctor that  accepts your insurance, provides certain services, etc. °- Physician Referral Service- 1-800-533-3463 ° °Chronic Pain Problems: °Organization           Address  Phone   Notes  °[facility]  (336) 297-2271 Patients need to be referred by their primary care doctor.  ° °Medication Assistance: °Organization         Address  Phone   Notes  °[place] [facility] [address] °[place], [place] [address] (336) 641-8030 --Must be a resident of [place] °-- Must have NO insurance coverage  whatsoever (no Medicaid/ Medicare, etc.) °-- The pt. MUST have a primary care doctor that directs their care regularly and follows them in the community °  °[facility]  (866) 331-1348   °[employer]  (888) 892-1162   ° °Agencies that provide inexpensive medical care: °Organization         Address  Phone   Notes  °[facility]  (336) 832-8035   °[facility]    (336) 832-7272   °[facility] [address] °[place], [place] [address] (336) 832-4777   °[facility] [address], °[place] (336) 271-4999   °[facility]    (336) 373-0678   °[facility]    (336) 272-1050   °[facility] ° [address], [place] Phone:  (336) 832-4444, Fax:  (336) 832-4440 Hours of Operation:  9 am - 6 pm, M-F.  Also accepts Medicaid/Medicare and self-pay.  °[facility] ° [address], [place] Phone: (336) 832-3150, Fax: (336) 832-3151. Hours of Operation:  8:30 am - 5:30 pm, M-F.  Also accepts Medicaid and self-pay.  °[facility] [place] [address], [place] Phone: (336) 878-6027   °[facility] [address], [place], [place] (336)723-1848, Ext. 123 Mondays & Thursdays: 7-9 AM.  First 15 patients are seen on a first come, first serve basis. °  ° °Medicaid-accepting [place] Providers: ° °Organization         Address  Phone   Notes  °[facility] [address], [place] (336) 641-2100 Also accepts self-pay patients.  °[facility] [address], [place] ° (336) 856-9996   °[facility] [address], [place] (336) 288-8857   °[facility] [address] [place] [address], [place] (336) 299-7000   °Veita Bland [address], [place]  ° (336) 373-1557 Only accepts [place] Access Medicaid patients after they have their name applied  to their card.  ° °Self-Pay (no insurance) in [place]: ° °Organization         Address  Phone   Notes  °Sickle Cell Patients, [facility] [address], [place] (336) 832-1970   °[facility] [address], [place] (336) 832-4400   °[facility] [place] ° [address], [place] (336) 992-4800   °[facility]/Dr. Osei-Bonsu ° [address] [place] [address], [place] or [address], [place] (336) 841-8500 Phone number for both [place] and [place] locations is the same.  °[facility] [address], [place] (336) 299-0000   °[facility]  [address] [place] [address],  or [address] (336) 852-7530 °(336) 878-2260   °[facility] [address],  (336) 350-1642, phone; (336) 294-5005, fax Sees patients 1st and 3rd Saturday of every month.  Must not qualify   for public or private insurance (i.e. Medicaid, Medicare, [place] Health Choice, Veterans' Benefits) • Household income should be no more than 200% of the poverty level •The clinic cannot treat you if you are pregnant or think you are pregnant • Sexually transmitted diseases are not treated at the clinic.  ° ° °Dental Care: °Organization         Address  Phone  Notes  °[facility] [address], [place] (336) 641-6152 Accepts children up to age 21 who are enrolled in Medicaid or [place] Health Choice; pregnant women with a Medicaid card; and children who have applied for Medicaid or [place] Health Choice, but were declined, whose parents can pay a reduced fee at time of service.  °[facility] [place]  [address], [place] (336) 641-7733 Accepts children up to age 21 who are enrolled in Medicaid or [place] Health Choice; pregnant women with a Medicaid card; and children who have applied for Medicaid or [place]  Health Choice, but were declined, whose parents can pay a reduced fee at time of service.  °[facility] ° [address], [place] (336) 641-4533 Patients are seen by appointment only. Walk-ins are not accepted. [facility] will see patients 18 years of age and older. °Monday - Tuesday (8am-5pm) °Most Wednesdays (8:30-5pm) °$30 per visit, cash only  °[facility] ° [address], [place] (336) 641-4533 Patients are seen by appointment only. Walk-ins are not accepted. [facility] will see patients 18 years of age and older. °One Wednesday Evening (Monthly: Volunteer Based).  $30 per visit, cash only  °[facility]  (919) 537-3737 for adults; Children under age 4, call Graduate Pediatric Dentistry at ([phone]) [phone]. Children aged 4-14, please call (919) [phone] to request a pediatric application. ° Dental services are provided in all areas of dental care including fillings, crowns and bridges, complete and partial dentures, implants, gum treatment, root canals, and extractions. Preventive care is also provided. Treatment is provided to both adults and children. °Patients are selected via a lottery and there is often a waiting list. °  °[facility] [address], °[place] ° (336) 763-8833 [email or web address] °  °[facility] [address], [place], [place] (336)723-1848, Ext. 123 Second and Fourth Thursday of each month, opens at 6:30 AM; Clinic ends at 9 AM.  Patients are seen on a first-come first-served basis, and a limited number are seen during each clinic.  ° °[facility] ° [address], [place], [place] (336) 723-7904   Eligibility Requirements °You must have lived in [place], [place], or [place] counties for at least the last three months. °  You cannot be eligible for state or federal sponsored healthcare insurance, including Veterans Administration, Medicaid, or Medicare. °   You generally cannot be eligible for healthcare insurance through your employer.  °  How to apply: °Eligibility screenings are held every Tuesday and Wednesday afternoon from 1:00 pm until 4:00 pm. You do not need an appointment for the interview!  °[facility] [address], [place], [place] 336-631-2330   °[facility]  336-342-8273   °[facility]  336-703-3100   °[facility]  336-570-6415   ° °Behavioral Health Resources in the Community: °Intensive Outpatient Programs °Organization         Address  Phone    Notes  °[facility] [address], [place], [place] 336-878-6098   °[facility] [address], [place], [place] 336-832-9800   °[facility] [address], [place], [place] ° 336-882-2125   °[facility] [address],  °[place], [place] 1-800-853-5163 or 336-641-4981   °Substance Abuse Resources °Organization         Address  Phone  Notes  °[facility]  336-882-2125   °[facility]  336-784-9470   °[facility]  336-285-9073   °[facility]  336-845-3988   °Residential & Outpatient Substance Abuse Program  1-800-659-3381   °Psychological Services °Organization         Address  Phone  Notes  °[facility]  336- 832-9600   °[facility]  336- 378-7881   °[facility] [address], [place] 1-800-853-5163 or 336-641-4981   ° °Mobile Crisis Teams °Organization         Address  Phone  Notes  °[facility], Mobile Crisis Care Unit  1-877-626-1772   °Assertive °[facility] ° [address] [address] [place], [place] 336-834-9664   °Sharon DeEsch [address] °[place] 336-554-5454   ° °Self-Help/Support Groups °Organization         Address  Phone             Notes  °[facility] - variety of support groups  336- 373-1402 Call  for more information  °Narcotics Anonymous (NA), [facility] [address], °[place]  2 meetings at this location  ° °Residential Treatment Programs °Organization         Address  Phone  Notes  °[facility] [address],    °[place]  1-866-801-8205   °[facility] ° [address], [place], [place] 704-293-8524   °[facility] [address], [place] 336-845-3988 Admissions: 8am-3pm M-F  °[facility] [address]    °[place], [place] 336-841-1104   °[facility] [address], [place], [place] 336-379-7146   °[facility] [address]  °[place], [place] 336-285-9073   °[facility] Intensive Outpatient [address], [place], [place] 336-852-3033   °[facility] [address]  °[place], [place] 1-877-615-2722 or 336-784-9470   °[facility] [address] [place], [place] 336-227-7417 Accepts Medicaid  °[facility] [address]  °[place] 1-800-659-3381 Substance Abuse/Addiction Treatment  ° °[place] Behavioral Health Resources °Organization         Address  Phone  Notes  °[facility]  (888) 581-9988   °Julie Brannon, PhD [address] [place], [place]   (336) 349-5553 or (336) 951-0000   °[facility]   [address] °[place], [place] (336) 349-4454   °[facility] [address], [place], [place] (336) 342-8316 Insurance/Medicaid/sponsorship through [facility]  °[facility] [address]                                    [place], [place] (336) 342-8316 Therapy/tele-psych/case  °[facility] [address]  ° [place], [place] (336) 349-2233    °Dr. Arfeen  (336) 349-4544   °[facility] [place]  [employer] [place]   [facility] 1) [address], [place] °2) [address], [place] °3)  [address], [place]  (336) 349-3220 °(336) 342-7768 ° °(336) 342-8140   °[place] [facility] (336) 342-1394 or (336) 342-3537 (After Hours)    ° ° ° °

## 2014-11-04 ENCOUNTER — Ambulatory Visit
Admission: RE | Admit: 2014-11-04 | Discharge: 2014-11-04 | Disposition: A | Discharge status: Home / Self Care | Payer: 59 | Source: Ambulatory Visit | Attending: Family Medicine | Admitting: Family Medicine

## 2014-11-04 ENCOUNTER — Other Ambulatory Visit: Payer: Self-pay | Admitting: Family Medicine

## 2014-11-04 DIAGNOSIS — R0602 Shortness of breath: Secondary | ICD-10-CM

## 2014-11-04 DIAGNOSIS — R059 Cough, unspecified: Secondary | ICD-10-CM

## 2014-11-04 DIAGNOSIS — R05 Cough: Secondary | ICD-10-CM

## 2014-11-19 ENCOUNTER — Emergency Department (HOSPITAL_COMMUNITY)
Admission: EM | Admit: 2014-11-19 | Discharge: 2014-11-19 | Disposition: A | Discharge status: Home / Self Care | Payer: 59 | Attending: Emergency Medicine | Admitting: Emergency Medicine

## 2014-11-19 ENCOUNTER — Encounter (HOSPITAL_COMMUNITY): Payer: Self-pay

## 2014-11-19 ENCOUNTER — Emergency Department (HOSPITAL_COMMUNITY): Payer: 59

## 2014-11-19 DIAGNOSIS — M1990 Unspecified osteoarthritis, unspecified site: Secondary | ICD-10-CM | POA: Insufficient documentation

## 2014-11-19 DIAGNOSIS — G5602 Carpal tunnel syndrome, left upper limb: Secondary | ICD-10-CM | POA: Diagnosis not present

## 2014-11-19 DIAGNOSIS — G8929 Other chronic pain: Secondary | ICD-10-CM | POA: Insufficient documentation

## 2014-11-19 DIAGNOSIS — M797 Fibromyalgia: Secondary | ICD-10-CM | POA: Diagnosis not present

## 2014-11-19 DIAGNOSIS — Z7982 Long term (current) use of aspirin: Secondary | ICD-10-CM | POA: Insufficient documentation

## 2014-11-19 DIAGNOSIS — L03012 Cellulitis of left finger: Secondary | ICD-10-CM | POA: Insufficient documentation

## 2014-11-19 DIAGNOSIS — Z720 Tobacco use: Secondary | ICD-10-CM | POA: Diagnosis not present

## 2014-11-19 DIAGNOSIS — R739 Hyperglycemia, unspecified: Secondary | ICD-10-CM

## 2014-11-19 DIAGNOSIS — G40909 Epilepsy, unspecified, not intractable, without status epilepticus: Secondary | ICD-10-CM | POA: Diagnosis not present

## 2014-11-19 DIAGNOSIS — Z794 Long term (current) use of insulin: Secondary | ICD-10-CM | POA: Diagnosis not present

## 2014-11-19 DIAGNOSIS — Z79899 Other long term (current) drug therapy: Secondary | ICD-10-CM | POA: Insufficient documentation

## 2014-11-19 DIAGNOSIS — M79642 Pain in left hand: Secondary | ICD-10-CM | POA: Diagnosis present

## 2014-11-19 DIAGNOSIS — E1165 Type 2 diabetes mellitus with hyperglycemia: Secondary | ICD-10-CM | POA: Insufficient documentation

## 2014-11-19 LAB — CBC
HCT: 48.6 % (ref 39.0–52.0)
HEMOGLOBIN: 16.7 g/dL (ref 13.0–17.0)
MCH: 29.6 pg (ref 26.0–34.0)
MCHC: 34.4 g/dL (ref 30.0–36.0)
MCV: 86.2 fL (ref 78.0–100.0)
Platelets: 196 10*3/uL (ref 150–400)
RBC: 5.64 MIL/uL (ref 4.22–5.81)
RDW: 14.3 % (ref 11.5–15.5)
WBC: 8.6 10*3/uL (ref 4.0–10.5)

## 2014-11-19 LAB — COMPREHENSIVE METABOLIC PANEL
ALBUMIN: 4 g/dL (ref 3.5–5.2)
ALK PHOS: 82 U/L (ref 39–117)
ALT: 19 U/L (ref 0–53)
ANION GAP: 11 (ref 5–15)
AST: 15 U/L (ref 0–37)
BUN: 11 mg/dL (ref 6–23)
CHLORIDE: 94 mmol/L — AB (ref 96–112)
CO2: 26 mmol/L (ref 19–32)
CREATININE: 0.74 mg/dL (ref 0.50–1.35)
Calcium: 9.5 mg/dL (ref 8.4–10.5)
GLUCOSE: 564 mg/dL — AB (ref 70–99)
POTASSIUM: 4.1 mmol/L (ref 3.5–5.1)
Sodium: 131 mmol/L — ABNORMAL LOW (ref 135–145)
Total Bilirubin: 0.5 mg/dL (ref 0.3–1.2)
Total Protein: 7.4 g/dL (ref 6.0–8.3)

## 2014-11-19 LAB — URINALYSIS, ROUTINE W REFLEX MICROSCOPIC
Bilirubin Urine: NEGATIVE
Glucose, UA: >1000 mg/dL — AB
Hgb urine dipstick: NEGATIVE
Ketones, ur: NEGATIVE mg/dL
Leukocytes, UA: NEGATIVE
Nitrite: NEGATIVE
Protein, ur: NEGATIVE mg/dL
SPECIFIC GRAVITY, URINE: 1.038 — AB (ref 1.005–1.030)
UROBILINOGEN UA: 0.2 mg/dL (ref 0.0–1.0)
pH: 6 (ref 5.0–8.0)

## 2014-11-19 LAB — CBG MONITORING, ED
GLUCOSE-CAPILLARY: 380 mg/dL — AB (ref 70–99)
Glucose-Capillary: 558 mg/dL (ref 70–99)
Glucose-Capillary: 250 mg/dL — ABNORMAL HIGH (ref 70–99)

## 2014-11-19 LAB — URINE MICROSCOPIC-ADD ON

## 2014-11-19 MED ORDER — SODIUM CHLORIDE 0.9 % IV BOLUS (SEPSIS)
1000.0000 mL | Freq: Once | INTRAVENOUS | Status: AC
Start: 2014-11-19 — End: 2014-11-19
  Administered 2014-11-19: 1000 mL via INTRAVENOUS

## 2014-11-19 MED ORDER — INSULIN ASPART 100 UNIT/ML IV SOLN
8.0000 [IU] | Freq: Once | INTRAVENOUS | Status: DC
  Filled 2014-11-19: qty 0.08

## 2014-11-19 MED ORDER — BACITRACIN ZINC 500 UNIT/GM EX OINT
TOPICAL_OINTMENT | Freq: Two times a day (BID) | CUTANEOUS | Status: DC
  Administered 2014-11-19: 17:00:00 via TOPICAL
  Filled 2014-11-19: qty 0.9

## 2014-11-19 MED ORDER — HYDROCODONE-ACETAMINOPHEN 5-325 MG PO TABS: 1.0000 | ORAL_TABLET | ORAL | Status: DC | PRN

## 2014-11-19 MED ORDER — INSULIN ASPART 100 UNIT/ML ~~LOC~~ SOLN
8.0000 [IU] | Freq: Once | SUBCUTANEOUS | Status: AC
  Administered 2014-11-19: 8 [IU] via INTRAVENOUS
  Filled 2014-11-19: qty 1

## 2014-11-19 MED ORDER — MORPHINE SULFATE 4 MG/ML IJ SOLN
6.0000 mg | Freq: Once | INTRAMUSCULAR | Status: AC
  Administered 2014-11-19: 6 mg via INTRAVENOUS
  Filled 2014-11-19: qty 2

## 2014-11-19 NOTE — Discharge Instructions (Signed)
If you were given medicines take as directed.  If you are on coumadin or contraceptives realize their levels and effectiveness is altered by many different medicines.  If you have any reaction (rash, tongues swelling, other) to the medicines stop taking and see a physician.   Follow closely with primary Dr. for improved glucose/diabetes control. Follow-up with the hand surgeon for further advice on possible carpal tunnel and hand pain. Return to the ER see a physician if your finger swells, spreading redness or warmth to the joints, fevers or pus draining from your finger. Please follow up as directed and return to the ER or see a physician for new or worsening symptoms.  Thank you. Filed Vitals:   11/19/14 1303 11/19/14 1526  BP: 117/90 121/80  Pulse: 81 88  Temp: 97.7 F (36.5 C)   TempSrc: Oral   Resp: 20 18  SpO2: 96% 97%

## 2014-11-19 NOTE — ED Provider Notes (Signed)
CSN: [id number]     Arrival date & time 11/19/14  1233 History   First MD Initiated Contact with Patient 11/19/14 1341     Chief Complaint  Patient presents with  . Hand Pain  . Numbness     (Consider location/radiation/quality/duration/timing/severity/associated sxs/prior Treatment) HPI Comments: 59 year old male with history of uncontrolled diabetes, fibromyalgia, os arthritis, smoker presents with left pointer finger tenderness and numb and tingling left hand radiating up forearm gradually worsening the past few days. Patient has a history of requiring surgery for hand infection the past with his diabetes. Patient denies any fevers or chills. 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 Pain constant worse with palpation of paronychia area. No change in medicines, patient on Lantus metformin. Patient has a history of carpal tunnel however this feels different.  Patient is a 59 y.o. male presenting with hand pain. The history is provided by the patient.  Hand Pain Pertinent negatives include no chest pain, no abdominal pain, no headaches and no shortness of breath.    Past Medical History  Diagnosis Date  . Back pain   . Diabetes mellitus without complication   . Fibromyalgia   . Seizures   . Arthritis, bil knees with chronic pain 11/07/2013   Past Surgical History  Procedure Laterality Date  . Knee surgery     Family History  Problem Relation Age of Onset  . Diabetes Mother   . Leukemia Father 90  . Healthy Sister   . Healthy Brother   . Healthy Sister   . Healthy Brother   . Healthy Brother   . Healthy Brother    History  Substance Use Topics  . Smoking status: Current Every Day Smoker -- 0.50 packs/day    Types: Cigarettes  . Smokeless tobacco: Never Used  . Alcohol Use: No    Review of Systems  Constitutional: Negative for fever and chills.  HENT: Negative for congestion.   Eyes: Negative for visual disturbance.   Respiratory: Negative for shortness of breath.   Cardiovascular: Negative for chest pain.  Gastrointestinal: Negative for vomiting and abdominal pain.  Genitourinary: Negative for dysuria and flank pain.  Musculoskeletal: Negative for back pain, neck pain and neck stiffness.  Skin: Negative for rash.  Neurological: Negative for light-headedness and headaches.      Allergies  Review of patient's allergies indicates no known allergies.  Home Medications   Prior to Admission medications   Medication Sig Start Date End Date Taking? Authorizing Provider  acetaminophen (TYLENOL) 325 MG tablet Take 2 tablets (650 mg total) by mouth every 4 (four) hours as needed for headache or mild pain. 11/08/13  Yes Rhonda G Barrett, PA-C  ibuprofen (ADVIL,MOTRIN) 800 MG tablet Take 800 mg by mouth every 8 (eight) hours as needed for mild pain or moderate pain.   Yes Historical Provider, MD  insulin aspart (NOVOLOG) 100 UNIT/ML injection Inject 15 Units into the skin 3 (three) times daily before meals.    Yes Historical Provider, MD  insulin glargine (LANTUS) 100 UNIT/ML injection Inject 75 Units into the skin daily.    Yes Historical Provider, MD  metFORMIN (GLUCOPHAGE) 1000 MG tablet Take 1,000 mg by mouth 2 (two) times daily with a meal.   Yes Historical Provider, MD  pregabalin (LYRICA) 300 MG capsule Take 300 mg by mouth 2 (two) times daily.    Yes Historical Provider, MD  traZODone (DESYREL) 50 MG tablet Take 50 mg by mouth at bedtime as  needed for sleep.   Yes Historical Provider, MD  aspirin EC 81 MG EC tablet Take 1 tablet (81 mg total) by mouth daily. Patient not taking: Reported on 11/19/2014 11/08/13   Joline Salt Barrett, PA-C  furosemide (LASIX) 20 MG tablet Take 1 tablet (20 mg total) by mouth daily. Patient not taking: Reported on 11/19/2014 04/20/14   Pricilla Loveless, MD  HYDROcodone-acetaminophen [facility]) 5-325 MG per tablet Take 1-2 tablets by mouth every 4 (four) hours as needed. 11/19/14   Blane Ohara, MD  HYDROcodone-acetaminophen (NORCO/VICODIN) 5-325 MG per tablet Take 1 tablet by mouth every 4 (four) hours as needed for moderate pain or severe pain. Patient not taking: Reported on 11/19/2014 04/20/14   Pricilla Loveless, MD   BP 121/80 mmHg  Pulse 88  Temp(Src) 97.7 F (36.5 C) (Oral)  Resp 18  SpO2 97% Physical Exam  Constitutional: He is oriented to person, place, and time. He appears well-developed and well-nourished.  HENT:  Head: Normocephalic and atraumatic.  Dry mucous membranes  Eyes: Conjunctivae are normal. Right eye exhibits no discharge. Left eye exhibits no discharge.  Neck: Normal range of motion. Neck supple. No tracheal deviation present.  Cardiovascular: Normal rate and regular rhythm.   Pulmonary/Chest: Effort normal and breath sounds normal.  Abdominal: Soft. He exhibits no distension. There is no tenderness. There is no guarding.  Musculoskeletal: He exhibits no edema.  Patient has 5+ strength with wrist flexion extension, finger flexion and extension in all digits, sensation decreased in all fingers left hand. 2+ pulses left wrist.  Neurological: He is alert and oriented to person, place, and time.  Skin: Skin is warm. No rash noted.  Patient has dry cracked skin hands bilateral. Patient has numbness in fingers bilaterally is chronic. Patient has decreased sensation distal fingers of left hand, full range of motion of fingers flexion extension without focal swelling of any digits, no swelling or edema of elbow or wrist, full range of motion of wrist and hand with mild discomfort generalized. No streaking erythema no warmth to the hand.  Psychiatric: He has a normal mood and affect.  Nursing note and vitals reviewed.   ED Course  Procedures (including critical care time) INCISION AND DRAINAGE Performed by: Enid Skeens Consent: Verbal consent obtained. Risks and benefits: risks, benefits and alternatives were discussed Type: abscess  Body area: left  pointer finger paronychia Anesthesia: local infiltration Incision was made with a  18 g needle Complexity: simple Drainage: minimal  Patient tolerance: Patient tolerated the procedure well with no immediate complications.    Labs Review Labs Reviewed  URINALYSIS, ROUTINE W REFLEX MICROSCOPIC - Abnormal; Notable for the following:    Specific Gravity, Urine 1.038 (*)    Glucose, UA >1000 (*)    All other components within normal limits  CBG MONITORING, ED - Abnormal; Notable for the following:    Glucose-Capillary 558 (*)    All other components within normal limits  CBC  URINE MICROSCOPIC-ADD ON  COMPREHENSIVE METABOLIC PANEL    Imaging Review Dg Hand Complete Left  11/19/2014   CLINICAL DATA:  Left hand numbness and tenderness. Pain radiating to the left shoulder. Erythema of the left index finger.  EXAM: LEFT HAND - COMPLETE 3+ VIEW  COMPARISON:  None.  FINDINGS: Minimal degenerative arthropathy. No significant bony abnormality or foreign body. Minimal spurring at the first carpometacarpal articulation.  IMPRESSION: 1. Mild osteoarthritis.  No acute findings.   Electronically Signed   By: Annitta Needs.D.  On: 11/19/2014 14:52     EKG Interpretation None      MDM   Final diagnoses:  Left hand pain  Carpal tunnel syndrome of left wrist  Paronychia of finger of left hand  Hyperglycemia   Patient presents with worsening hand pain and numbness since this morning. Discussed differential diagnosis including carpal tunnel, diabetic neuropathy, early infection secondary to paronychia. Patient well-appearing overall, dehydrated clinically vitals unremarkable afebrile. Plan for blood work, IV fluids, pain meds, x-ray, needle aspiration of paronychia.  No significant purulent drainage to finger with needle aspiration.  Stressed the importance of a primary care doctor follow-up for diabetes control and for follow-up with hand surgery for possible carpal tunnel and hand  pain. X-ray results reviewed no acute findings mild arthritis.  Results and differential diagnosis were discussed with the patient/parent/guardian. Close follow up outpatient was discussed, comfortable with the plan.   Medications  bacitracin ointment (not administered)  insulin aspart (novoLOG) injection 8 Units (not administered)  sodium chloride 0.9 % bolus 1,000 mL (1,000 mLs Intravenous New Bag/Given 11/19/14 1434)  morphine 4 MG/ML injection 6 mg (6 mg Intravenous Given 11/19/14 1434)    Filed Vitals:   11/19/14 1303 11/19/14 1526  BP: 117/90 121/80  Pulse: 81 88  Temp: 97.7 F (36.5 C)   TempSrc: Oral   Resp: 20 18  SpO2: 96% 97%    Final diagnoses:  Left hand pain  Carpal tunnel syndrome of left wrist  Paronychia of finger of left hand  Hyperglycemia   Pt care signed out to fup repeat glu, once improved pt to fup closely outpt. The    Blane OharaJoshua Skeeter Sheard, MD 11/19/14 1538

## 2014-11-19 NOTE — Progress Notes (Signed)
EDCM spoke to patient at bedside.  Patient confirms his pcp is Dr. Maryelizabeth RowanElizabeth Dewey.  System updated.

## 2014-11-19 NOTE — ED Notes (Signed)
Patient states he woke and noticed that he left hand was numb and tender. Patient states the pain radiates into his left shoulder. Patient has slight redness and swelling to his left pointer finger at the cuticle.

## 2015-01-11 ENCOUNTER — Encounter (HOSPITAL_COMMUNITY): Payer: Self-pay | Admitting: Emergency Medicine

## 2015-01-11 ENCOUNTER — Emergency Department (HOSPITAL_COMMUNITY): Payer: 59

## 2015-01-11 ENCOUNTER — Emergency Department (HOSPITAL_COMMUNITY)
Admission: EM | Admit: 2015-01-11 | Discharge: 2015-01-11 | Disposition: A | Discharge status: Home / Self Care | Payer: 59 | Attending: Emergency Medicine | Admitting: Emergency Medicine

## 2015-01-11 DIAGNOSIS — M1990 Unspecified osteoarthritis, unspecified site: Secondary | ICD-10-CM | POA: Insufficient documentation

## 2015-01-11 DIAGNOSIS — G8929 Other chronic pain: Secondary | ICD-10-CM | POA: Diagnosis not present

## 2015-01-11 DIAGNOSIS — M797 Fibromyalgia: Secondary | ICD-10-CM | POA: Diagnosis not present

## 2015-01-11 DIAGNOSIS — Z794 Long term (current) use of insulin: Secondary | ICD-10-CM | POA: Insufficient documentation

## 2015-01-11 DIAGNOSIS — G40909 Epilepsy, unspecified, not intractable, without status epilepticus: Secondary | ICD-10-CM | POA: Diagnosis not present

## 2015-01-11 DIAGNOSIS — Z720 Tobacco use: Secondary | ICD-10-CM | POA: Insufficient documentation

## 2015-01-11 DIAGNOSIS — J029 Acute pharyngitis, unspecified: Secondary | ICD-10-CM | POA: Diagnosis present

## 2015-01-11 DIAGNOSIS — R490 Dysphonia: Secondary | ICD-10-CM

## 2015-01-11 DIAGNOSIS — E1165 Type 2 diabetes mellitus with hyperglycemia: Secondary | ICD-10-CM | POA: Insufficient documentation

## 2015-01-11 DIAGNOSIS — Z79899 Other long term (current) drug therapy: Secondary | ICD-10-CM | POA: Insufficient documentation

## 2015-01-11 DIAGNOSIS — R739 Hyperglycemia, unspecified: Secondary | ICD-10-CM

## 2015-01-11 LAB — URINALYSIS, ROUTINE W REFLEX MICROSCOPIC
Bilirubin Urine: NEGATIVE
Glucose, UA: >1000 mg/dL — AB
Hgb urine dipstick: NEGATIVE
Ketones, ur: NEGATIVE mg/dL
LEUKOCYTES UA: NEGATIVE
Nitrite: NEGATIVE
PH: 6.5 (ref 5.0–8.0)
Protein, ur: NEGATIVE mg/dL
Specific Gravity, Urine: 1.043 — ABNORMAL HIGH (ref 1.005–1.030)
UROBILINOGEN UA: 0.2 mg/dL (ref 0.0–1.0)

## 2015-01-11 LAB — CBC WITH DIFFERENTIAL/PLATELET
BASOS ABS: 0.1 10*3/uL (ref 0.0–0.1)
Basophils Relative: 1 % (ref 0–1)
Eosinophils Absolute: 0.2 10*3/uL (ref 0.0–0.7)
Eosinophils Relative: 3 % (ref 0–5)
HCT: 45.4 % (ref 39.0–52.0)
Hemoglobin: 15.5 g/dL (ref 13.0–17.0)
LYMPHS PCT: 27 % (ref 12–46)
Lymphs Abs: 1.7 10*3/uL (ref 0.7–4.0)
MCH: 29.5 pg (ref 26.0–34.0)
MCHC: 34.1 g/dL (ref 30.0–36.0)
MCV: 86.5 fL (ref 78.0–100.0)
Monocytes Absolute: 0.4 10*3/uL (ref 0.1–1.0)
Monocytes Relative: 6 % (ref 3–12)
Neutro Abs: 3.9 10*3/uL (ref 1.7–7.7)
Neutrophils Relative %: 63 % (ref 43–77)
Platelets: 153 10*3/uL (ref 150–400)
RBC: 5.25 MIL/uL (ref 4.22–5.81)
RDW: 14.1 % (ref 11.5–15.5)
WBC: 6.2 10*3/uL (ref 4.0–10.5)

## 2015-01-11 LAB — BASIC METABOLIC PANEL
Anion gap: 7 (ref 5–15)
BUN: 12 mg/dL (ref 6–20)
CHLORIDE: 105 mmol/L (ref 101–111)
CO2: 25 mmol/L (ref 22–32)
Calcium: 8.7 mg/dL — ABNORMAL LOW (ref 8.9–10.3)
Creatinine, Ser: 0.52 mg/dL — ABNORMAL LOW (ref 0.61–1.24)
GFR calc Af Amer: >60 mL/min (ref >60)
Glucose, Bld: 292 mg/dL — ABNORMAL HIGH (ref 65–99)
POTASSIUM: 4 mmol/L (ref 3.5–5.1)
Sodium: 137 mmol/L (ref 135–145)

## 2015-01-11 LAB — CBG MONITORING, ED: GLUCOSE-CAPILLARY: 386 mg/dL — AB (ref 65–99)

## 2015-01-11 LAB — URINE MICROSCOPIC-ADD ON

## 2015-01-11 LAB — RAPID STREP SCREEN (MED CTR MEBANE ONLY): Streptococcus, Group A Screen (Direct): NEGATIVE

## 2015-01-11 MED ORDER — SODIUM CHLORIDE 0.9 % IV BOLUS (SEPSIS)
1000.0000 mL | Freq: Once | INTRAVENOUS | Status: AC
  Administered 2015-01-11: 1000 mL via INTRAVENOUS

## 2015-01-11 NOTE — ED Notes (Signed)
Awake. Verbally responsive. A/O x4. Resp even and unlabored. No audible adventitious breath sounds noted. ABC's intact. SR on monitor. IV saline lock patent and intact. 

## 2015-01-11 NOTE — ED Notes (Signed)
Pt arrived to the ED with a complaint of a sore throat and weakness.  Pt states that he feels he is dehydrated.  Pt is a diabetic but has been unable to check his sugar due to a machine failure.

## 2015-01-11 NOTE — ED Notes (Signed)
Awake. Verbally responsive. A/O x4. Resp even and unlabored. No audible adventitious breath sounds noted. ABC's intact. SR on monitor. 

## 2015-01-11 NOTE — Discharge Instructions (Signed)
Hoarseness Hoarseness is produced from a variety of causes. It is important to find the cause so it can be treated. In the absence of a cold or upper respiratory illness, any hoarseness lasting more than 2 weeks should be looked at by a specialist. This is especially important if you have a history of smoking or alcohol use. It is also important to keep in mind that as you grow older, your voice will naturally get weaker, making it easier for you to become hoarse from straining your vocal cords.  CAUSES  Any illness that affects your vocal cords can result in a hoarse voice. Examples of conditions that can affect the vocal cords are listed as follows:   Allergies.  Colds.  Sinusitis.  Gastroesophageal reflux disease.  Croup.  Injury.  Nodules.  Exposure to smoke or toxic fumes or gases.  Congenital and genetic defects.  Paralysis of the vocal cords.  Infections.  Advanced age. DIAGNOSIS  In order to diagnose the cause of your hoarseness, your caregiver will examine your throat using an instrument that uses a tube with a small lighted camera (laryngoscope). It allows your caregiver to look into the mouth and down the throat. TREATMENT  For most cases, treatment will focus on the specific cause of the hoarseness. Depending on the cause, hoarseness can be a temporary condition (acute) or it can be long lasting (chronic). Most cases of hoarseness clear up without complications. Your caregiver will explain to you if this is not likely to happen. SEEK IMMEDIATE MEDICAL CARE IF:   You have increasing hoarseness or loss of voice.  You have shortness of breath.  You are coughing up blood.  There is pain in your neck or throat. Document Released: 07/16/2005 Document Revised: 10/25/2011 Document Reviewed: 10/08/2010 [facility] Patient Information 2015 [place], [place]. This information is not intended to replace advice given to you by your health care provider. Make sure you discuss any  questions you have with your health care provider.    Hyperglycemia Hyperglycemia occurs when the glucose (sugar) in your blood is too high. Hyperglycemia can happen for many reasons, but it most often happens to people who do not know they have diabetes or are not managing their diabetes properly.  CAUSES  Whether you have diabetes or not, there are other causes of hyperglycemia. Hyperglycemia can occur when you have diabetes, but it can also occur in other situations that you might not be as aware of, such as: Diabetes  If you have diabetes and are having problems controlling your blood glucose, hyperglycemia could occur because of some of the following reasons:  Not following your meal plan.  Not taking your diabetes medications or not taking it properly.  Exercising less or doing less activity than you normally do.  Being sick. Pre-diabetes  This cannot be ignored. Before people develop Type 2 diabetes, they almost always have "pre-diabetes." This is when your blood glucose levels are higher than normal, but not yet high enough to be diagnosed as diabetes. Research has shown that some long-term damage to the body, especially the heart and circulatory system, may already be occurring during pre-diabetes. If you take action to manage your blood glucose when you have pre-diabetes, you may delay or prevent Type 2 diabetes from developing. Stress  If you have diabetes, you may be "diet" controlled or on oral medications or insulin to control your diabetes. However, you may find that your blood glucose is higher than usual in the hospital whether you have  diabetes or not. This is often referred to as "stress hyperglycemia." Stress can elevate your blood glucose. This happens because of hormones put out by the body during times of stress. If stress has been the cause of your high blood glucose, it can be followed regularly by your caregiver. That way he/she can make sure your hyperglycemia does  not continue to get worse or progress to diabetes. Steroids  Steroids are medications that act on the infection fighting system (immune system) to block inflammation or infection. One side effect can be a rise in blood glucose. Most people can produce enough extra insulin to allow for this rise, but for those who cannot, steroids make blood glucose levels go even higher. It is not unusual for steroid treatments to "uncover" diabetes that is developing. It is not always possible to determine if the hyperglycemia will go away after the steroids are stopped. A special blood test called an A1c is sometimes done to determine if your blood glucose was elevated before the steroids were started. SYMPTOMS  Thirsty.  Frequent urination.  Dry mouth.  Blurred vision.  Tired or fatigue.  Weakness.  Sleepy.  Tingling in feet or leg. DIAGNOSIS  Diagnosis is made by monitoring blood glucose in one or all of the following ways:  A1c test. This is a chemical found in your blood.  Fingerstick blood glucose monitoring.  Laboratory results. TREATMENT  First, knowing the cause of the hyperglycemia is important before the hyperglycemia can be treated. Treatment may include, but is not be limited to:  Education.  Change or adjustment in medications.  Change or adjustment in meal plan.  Treatment for an illness, infection, etc.  More frequent blood glucose monitoring.  Change in exercise plan.  Decreasing or stopping steroids.  Lifestyle changes. HOME CARE INSTRUCTIONS   Test your blood glucose as directed.  Exercise regularly. Your caregiver will give you instructions about exercise. Pre-diabetes or diabetes which comes on with stress is helped by exercising.  Eat wholesome, balanced meals. Eat often and at regular, fixed times. Your caregiver or nutritionist will give you a meal plan to guide your sugar intake.  Being at an ideal weight is important. If needed, losing as little as 10  to 15 pounds may help improve blood glucose levels. SEEK MEDICAL CARE IF:   You have questions about medicine, activity, or diet.  You continue to have symptoms (problems such as increased thirst, urination, or weight gain). SEEK IMMEDIATE MEDICAL CARE IF:   You are vomiting or have diarrhea.  Your breath smells fruity.  You are breathing faster or slower.  You are very sleepy or incoherent.  You have numbness, tingling, or pain in your feet or hands.  You have chest pain.  Your symptoms get worse even though you have been following your caregiver's orders.  If you have any other questions or concerns. Document Released: 01/26/2001 Document Revised: 10/25/2011 Document Reviewed: 11/29/2011 [facility] Patient Information 2015 [place]. This information is not intended to replace advice given to you by your health care provider. Make sure you discuss any questions you have with your health care provider.

## 2015-01-11 NOTE — ED Notes (Signed)
Pt reported having urinary frequency and feeling thirsty. Pt stated that he thought he was dehydrated d/t mouth dryness.

## 2015-01-11 NOTE — ED Notes (Signed)
Awake. Verbally responsive. A/O x4. Resp even and unlabored. No audible adventitious breath sounds noted. ABC's intact. SR on monitor. IV saline lock patent and intact. Pt ambulated to BR with steady gait.

## 2015-01-11 NOTE — ED Provider Notes (Signed)
CSN: [id number]     Arrival date & time 01/11/15  [id number] History   First MD Initiated Contact with Patient 01/11/15 [phone]     Chief Complaint  Patient presents with  . Sore Throat  . Dehydration     (Consider location/radiation/quality/duration/timing/severity/associated sxs/prior Treatment) HPI  59 year old male with a past history of diabetes presents with feeling dehydrated as well as a hoarse voice. The patient states his been having a dry cough as well as a raspy voice for the past 5 days. He denies sore throat, neck pain, neck stiffness, or trouble breathing or eating. He denies any fevers or chills. He states he is not having any vomiting or diarrhea but has felt progressive dried lips and mouth and feels similar to when he was dehydrated and the past. Patient denies any headache, chest pain, or back pain. No shortness of breath. Patient states he is a diabetic and that he thinks his glucose is elevated. He is also feeling generically fatigued but denies focal weakness.  Past Medical History  Diagnosis Date  . Back pain   . Diabetes mellitus without complication   . Fibromyalgia   . Seizures   . Arthritis, bil knees with chronic pain 11/07/2013   Past Surgical History  Procedure Laterality Date  . Knee surgery     Family History  Problem Relation Age of Onset  . Diabetes Mother   . Leukemia Father 1749  . Healthy Sister   . Healthy Brother   . Healthy Sister   . Healthy Brother   . Healthy Brother   . Healthy Brother    History  Substance Use Topics  . Smoking status: Current Every Day Smoker -- 0.50 packs/day    Types: Cigarettes  . Smokeless tobacco: Never Used  . Alcohol Use: No    Review of Systems  Constitutional: Positive for fatigue. Negative for fever.  HENT: Positive for voice change. Negative for congestion, rhinorrhea, sore throat and trouble swallowing.   Respiratory: Positive for cough. Negative for shortness of breath.   Cardiovascular: Negative for  chest pain and leg swelling.  Gastrointestinal: Negative for vomiting and diarrhea.  Neurological: Negative for weakness.  All other systems reviewed and are negative.     Allergies  Review of patient's allergies indicates no known allergies.  Home Medications   Prior to Admission medications   Medication Sig Start Date End Date Taking? Authorizing Provider  acetaminophen (TYLENOL) 325 MG tablet Take 2 tablets (650 mg total) by mouth every 4 (four) hours as needed for headache or mild pain. 11/08/13   Joline Salthonda G Barrett, PA-C  aspirin EC 81 MG EC tablet Take 1 tablet (81 mg total) by mouth daily. Patient not taking: Reported on 11/19/2014 11/08/13   Joline Salthonda G Barrett, PA-C  furosemide (LASIX) 20 MG tablet Take 1 tablet (20 mg total) by mouth daily. Patient not taking: Reported on 11/19/2014 04/20/14   Pricilla LovelessScott Sylena Lotter, MD  HYDROcodone-acetaminophen [facility](NORCO) 5-325 MG per tablet Take 1-2 tablets by mouth every 4 (four) hours as needed. 11/19/14   Blane OharaJoshua Zavitz, MD  HYDROcodone-acetaminophen (NORCO/VICODIN) 5-325 MG per tablet Take 1 tablet by mouth every 4 (four) hours as needed for moderate pain or severe pain. Patient not taking: Reported on 11/19/2014 04/20/14   Pricilla LovelessScott Shakiera Edelson, MD  ibuprofen (ADVIL,MOTRIN) 800 MG tablet Take 800 mg by mouth every 8 (eight) hours as needed for mild pain or moderate pain.    Historical Provider, MD  insulin aspart (NOVOLOG) 100 UNIT/ML injection Inject 15  Units into the skin 3 (three) times daily before meals.     Historical Provider, MD  insulin glargine (LANTUS) 100 UNIT/ML injection Inject 75 Units into the skin daily.     Historical Provider, MD  metFORMIN (GLUCOPHAGE) 1000 MG tablet Take 1,000 mg by mouth 2 (two) times daily with a meal.    Historical Provider, MD  pregabalin (LYRICA) 300 MG capsule Take 300 mg by mouth 2 (two) times daily.     Historical Provider, MD  traZODone (DESYREL) 50 MG tablet Take 50 mg by mouth at bedtime as needed for sleep.    Historical  Provider, MD   BP 130/82 mmHg  Pulse 81  Temp(Src) 98.2 F (36.8 C) (Oral)  Resp 18  Ht  (1.702 m)  Wt 230 lb (104.327 kg)  BMI 36.01 kg/m2  SpO2 95% Physical Exam  Constitutional: He is oriented to person, place, and time. He appears well-developed and well-nourished.  HENT:  Head: Normocephalic and atraumatic.  Right Ear: External ear normal.  Left Ear: External ear normal.  Nose: Nose normal.  Mouth/Throat: Mucous membranes are dry. No oropharyngeal exudate, posterior oropharyngeal edema, posterior oropharyngeal erythema or tonsillar abscesses.  Eyes: Right eye exhibits no discharge. Left eye exhibits no discharge.  Neck: Normal range of motion and full passive range of motion without pain. Neck supple. No spinous process tenderness and no muscular tenderness present. No rigidity.  Cardiovascular: Normal rate, regular rhythm, normal heart sounds and intact distal pulses.   Pulmonary/Chest: Effort normal and breath sounds normal.  Abdominal: Soft. He exhibits no distension. There is no tenderness.  Musculoskeletal: He exhibits no edema.  Neurological: He is alert and oriented to person, place, and time.  Skin: Skin is warm and dry.  Nursing note and vitals reviewed.   ED Course  Procedures (including critical care time) Labs Review Labs Reviewed  BASIC METABOLIC PANEL - Abnormal; Notable for the following:    Glucose, Bld 292 (*)    Creatinine, Ser 0.52 (*)    Calcium 8.7 (*)    All other components within normal limits  URINALYSIS, ROUTINE W REFLEX MICROSCOPIC (NOT AT [facility]) - Abnormal; Notable for the following:    Specific Gravity, Urine 1.043 (*)    Glucose, UA >1000 (*)    All other components within normal limits  CBG MONITORING, ED - Abnormal; Notable for the following:    Glucose-Capillary 386 (*)    All other components within normal limits  RAPID STREP SCREEN (NOT AT [facility])  CULTURE, GROUP A STREP  CBC WITH DIFFERENTIAL/PLATELET  URINE MICROSCOPIC-ADD  ON    Imaging Review Dg Chest 2 View  01/11/2015   CLINICAL DATA:  Nonproductive cough 2-3 days.  EXAM: CHEST  2 VIEW  COMPARISON:  11/04/2014  FINDINGS: Lungs are hypoinflated without focal consolidation or effusion. Cardiomediastinal silhouette and remainder of the exam is unchanged.  IMPRESSION: Hypoinflation without acute cardiopulmonary disease.   Electronically Signed   By: Elberta Fortis M.D.   On: 01/11/2015 07:52     EKG Interpretation None      MDM   Final diagnoses:  Hoarseness of voice  Hyperglycemia    Patient appears to have a viral URI causing his mild cough and hoarseness. No signs of deep space neck infection and appears well. Mildly drug mucous membranes, feels improved with IV fluids. VSS, labs unremarkable except hyperglycemia without acidosis. Stable for d/c home.    Pricilla Loveless, MD 01/11/15 1728

## 2015-01-14 LAB — CULTURE, GROUP A STREP

## 2015-02-25 ENCOUNTER — Emergency Department (HOSPITAL_COMMUNITY): Payer: 59

## 2015-02-25 ENCOUNTER — Inpatient Hospital Stay (HOSPITAL_COMMUNITY)
Admission: EM | Admit: 2015-02-25 | Discharge: 2015-02-28 | DRG: 070 | Disposition: A | Discharge status: Home / Self Care | Payer: 59 | Attending: Internal Medicine | Admitting: Internal Medicine

## 2015-02-25 ENCOUNTER — Encounter (HOSPITAL_COMMUNITY): Payer: Self-pay | Admitting: Emergency Medicine

## 2015-02-25 DIAGNOSIS — M13861 Other specified arthritis, right knee: Secondary | ICD-10-CM | POA: Diagnosis present

## 2015-02-25 DIAGNOSIS — E119 Type 2 diabetes mellitus without complications: Secondary | ICD-10-CM

## 2015-02-25 DIAGNOSIS — E876 Hypokalemia: Secondary | ICD-10-CM | POA: Diagnosis present

## 2015-02-25 DIAGNOSIS — M549 Dorsalgia, unspecified: Secondary | ICD-10-CM | POA: Diagnosis present

## 2015-02-25 DIAGNOSIS — E0811 Diabetes mellitus due to underlying condition with ketoacidosis with coma: Secondary | ICD-10-CM | POA: Diagnosis not present

## 2015-02-25 DIAGNOSIS — E860 Dehydration: Secondary | ICD-10-CM | POA: Diagnosis present

## 2015-02-25 DIAGNOSIS — IMO0002 Reserved for concepts with insufficient information to code with codable children: Secondary | ICD-10-CM | POA: Diagnosis present

## 2015-02-25 DIAGNOSIS — F17210 Nicotine dependence, cigarettes, uncomplicated: Secondary | ICD-10-CM | POA: Diagnosis present

## 2015-02-25 DIAGNOSIS — M797 Fibromyalgia: Secondary | ICD-10-CM | POA: Diagnosis present

## 2015-02-25 DIAGNOSIS — G8929 Other chronic pain: Secondary | ICD-10-CM | POA: Diagnosis present

## 2015-02-25 DIAGNOSIS — M13862 Other specified arthritis, left knee: Secondary | ICD-10-CM | POA: Diagnosis present

## 2015-02-25 DIAGNOSIS — G40909 Epilepsy, unspecified, not intractable, without status epilepticus: Secondary | ICD-10-CM | POA: Diagnosis present

## 2015-02-25 DIAGNOSIS — Z794 Long term (current) use of insulin: Secondary | ICD-10-CM | POA: Diagnosis not present

## 2015-02-25 DIAGNOSIS — E1165 Type 2 diabetes mellitus with hyperglycemia: Secondary | ICD-10-CM | POA: Diagnosis present

## 2015-02-25 DIAGNOSIS — G9341 Metabolic encephalopathy: Secondary | ICD-10-CM | POA: Diagnosis present

## 2015-02-25 DIAGNOSIS — E1310 Other specified diabetes mellitus with ketoacidosis without coma: Secondary | ICD-10-CM | POA: Diagnosis present

## 2015-02-25 DIAGNOSIS — E1140 Type 2 diabetes mellitus with diabetic neuropathy, unspecified: Secondary | ICD-10-CM

## 2015-02-25 DIAGNOSIS — R4182 Altered mental status, unspecified: Secondary | ICD-10-CM

## 2015-02-25 DIAGNOSIS — E1110 Type 2 diabetes mellitus with ketoacidosis without coma: Secondary | ICD-10-CM | POA: Diagnosis present

## 2015-02-25 LAB — BASIC METABOLIC PANEL
ANION GAP: 7 (ref 5–15)
Anion gap: 14 (ref 5–15)
Anion gap: 11 (ref 5–15)
BUN: 19 mg/dL (ref 6–20)
BUN: 19 mg/dL (ref 6–20)
BUN: 20 mg/dL (ref 6–20)
CO2: 23 mmol/L (ref 22–32)
CO2: 26 mmol/L (ref 22–32)
CO2: 23 mmol/L (ref 22–32)
Calcium: 8.9 mg/dL (ref 8.9–10.3)
Calcium: 8.8 mg/dL — ABNORMAL LOW (ref 8.9–10.3)
Calcium: 8.9 mg/dL (ref 8.9–10.3)
Chloride: 107 mmol/L (ref 101–111)
Chloride: 111 mmol/L (ref 101–111)
Chloride: 110 mmol/L (ref 101–111)
Creatinine, Ser: 0.91 mg/dL (ref 0.61–1.24)
Creatinine, Ser: 0.71 mg/dL (ref 0.61–1.24)
Creatinine, Ser: 0.71 mg/dL (ref 0.61–1.24)
GFR calc Af Amer: >60 mL/min (ref >60)
GFR calc non Af Amer: >60 mL/min (ref >60)
GFR calc non Af Amer: >60 mL/min (ref >60)
GLUCOSE: 317 mg/dL — AB (ref 65–99)
GLUCOSE: 184 mg/dL — AB (ref 65–99)
GLUCOSE: 175 mg/dL — AB (ref 65–99)
Potassium: 4.1 mmol/L (ref 3.5–5.1)
Potassium: 3.8 mmol/L (ref 3.5–5.1)
Potassium: 3.8 mmol/L (ref 3.5–5.1)
SODIUM: 144 mmol/L (ref 135–145)
SODIUM: 144 mmol/L (ref 135–145)
Sodium: 144 mmol/L (ref 135–145)

## 2015-02-25 LAB — URINE MICROSCOPIC-ADD ON

## 2015-02-25 LAB — COMPREHENSIVE METABOLIC PANEL
ALBUMIN: 3.7 g/dL (ref 3.5–5.0)
ALT: 15 U/L — ABNORMAL LOW (ref 17–63)
AST: 13 U/L — AB (ref 15–41)
Alkaline Phosphatase: 78 U/L (ref 38–126)
Anion gap: 18 — ABNORMAL HIGH (ref 5–15)
BUN: 21 mg/dL — ABNORMAL HIGH (ref 6–20)
CHLORIDE: 104 mmol/L (ref 101–111)
CO2: 19 mmol/L — ABNORMAL LOW (ref 22–32)
Calcium: 9.2 mg/dL (ref 8.9–10.3)
Creatinine, Ser: 0.96 mg/dL (ref 0.61–1.24)
Glucose, Bld: 405 mg/dL — ABNORMAL HIGH (ref 65–99)
POTASSIUM: 4.2 mmol/L (ref 3.5–5.1)
SODIUM: 141 mmol/L (ref 135–145)
Total Bilirubin: 1.1 mg/dL (ref 0.3–1.2)
Total Protein: 7.3 g/dL (ref 6.5–8.1)

## 2015-02-25 LAB — I-STAT TROPONIN, ED: Troponin i, poc: 0 ng/mL (ref 0.00–0.08)

## 2015-02-25 LAB — RAPID URINE DRUG SCREEN, HOSP PERFORMED
Amphetamines: NOT DETECTED
Barbiturates: NOT DETECTED
Benzodiazepines: NOT DETECTED
COCAINE: NOT DETECTED
Opiates: POSITIVE — AB
TETRAHYDROCANNABINOL: NOT DETECTED

## 2015-02-25 LAB — URINALYSIS, ROUTINE W REFLEX MICROSCOPIC
Glucose, UA: >1000 mg/dL — AB
Hgb urine dipstick: NEGATIVE
Ketones, ur: >80 mg/dL — AB
LEUKOCYTES UA: NEGATIVE
Nitrite: NEGATIVE
PROTEIN: NEGATIVE mg/dL
Specific Gravity, Urine: 1.039 — ABNORMAL HIGH (ref 1.005–1.030)
Urobilinogen, UA: 0.2 mg/dL (ref 0.0–1.0)
pH: 5 (ref 5.0–8.0)

## 2015-02-25 LAB — GLUCOSE, CAPILLARY
GLUCOSE-CAPILLARY: 170 mg/dL — AB (ref 65–99)
GLUCOSE-CAPILLARY: 181 mg/dL — AB (ref 65–99)
Glucose-Capillary: 306 mg/dL — ABNORMAL HIGH (ref 65–99)
Glucose-Capillary: 234 mg/dL — ABNORMAL HIGH (ref 65–99)
Glucose-Capillary: 221 mg/dL — ABNORMAL HIGH (ref 65–99)
Glucose-Capillary: 203 mg/dL — ABNORMAL HIGH (ref 65–99)
Glucose-Capillary: 178 mg/dL — ABNORMAL HIGH (ref 65–99)
Glucose-Capillary: 134 mg/dL — ABNORMAL HIGH (ref 65–99)

## 2015-02-25 LAB — CK: Total CK: 38 U/L — ABNORMAL LOW (ref 49–397)

## 2015-02-25 LAB — CBC WITH DIFFERENTIAL/PLATELET
BASOS ABS: 0 10*3/uL (ref 0.0–0.1)
Basophils Relative: 0 % (ref 0–1)
EOS ABS: 0.1 10*3/uL (ref 0.0–0.7)
Eosinophils Relative: 1 % (ref 0–5)
HEMATOCRIT: 49.8 % (ref 39.0–52.0)
Hemoglobin: 16.8 g/dL (ref 13.0–17.0)
Lymphocytes Relative: 14 % (ref 12–46)
Lymphs Abs: 1.1 10*3/uL (ref 0.7–4.0)
MCH: 29.4 pg (ref 26.0–34.0)
MCHC: 33.7 g/dL (ref 30.0–36.0)
MCV: 87.1 fL (ref 78.0–100.0)
MONOS PCT: 3 % (ref 3–12)
Monocytes Absolute: 0.2 10*3/uL (ref 0.1–1.0)
Neutro Abs: 6.5 10*3/uL (ref 1.7–7.7)
Neutrophils Relative %: 82 % — ABNORMAL HIGH (ref 43–77)
Platelets: 147 10*3/uL — ABNORMAL LOW (ref 150–400)
RBC: 5.72 MIL/uL (ref 4.22–5.81)
RDW: 14.5 % (ref 11.5–15.5)
WBC: 7.9 10*3/uL (ref 4.0–10.5)

## 2015-02-25 LAB — MAGNESIUM: Magnesium: 1.8 mg/dL (ref 1.7–2.4)

## 2015-02-25 LAB — PROTIME-INR
INR: 0.97 (ref 0.00–1.49)
Prothrombin Time: 13.1 seconds (ref 11.6–15.2)

## 2015-02-25 LAB — AMMONIA: Ammonia: 16 umol/L (ref 9–35)

## 2015-02-25 LAB — CBG MONITORING, ED
GLUCOSE-CAPILLARY: 357 mg/dL — AB (ref 65–99)
Glucose-Capillary: 317 mg/dL — ABNORMAL HIGH (ref 65–99)

## 2015-02-25 LAB — PHOSPHORUS: Phosphorus: 1.2 mg/dL — ABNORMAL LOW (ref 2.5–4.6)

## 2015-02-25 LAB — I-STAT CG4 LACTIC ACID, ED: LACTIC ACID, VENOUS: 1.85 mmol/L (ref 0.5–2.0)

## 2015-02-25 LAB — MRSA PCR SCREENING: MRSA BY PCR: NEGATIVE

## 2015-02-25 MED ORDER — DEXTROSE-NACL 5-0.45 % IV SOLN: INTRAVENOUS | Status: DC

## 2015-02-25 MED ORDER — SODIUM CHLORIDE 0.9 % IV SOLN
INTRAVENOUS | Status: DC
  Administered 2015-02-25: 2.6 [IU]/h via INTRAVENOUS
  Filled 2015-02-25: qty 2.5

## 2015-02-25 MED ORDER — SODIUM CHLORIDE 0.9 % IV SOLN
INTRAVENOUS | Status: DC
  Administered 2015-02-25: 7.2 [IU]/h via INTRAVENOUS
  Administered 2015-02-26: 0.9 [IU]/h via INTRAVENOUS

## 2015-02-25 MED ORDER — ENOXAPARIN SODIUM 40 MG/0.4ML ~~LOC~~ SOLN
40.0000 mg | SUBCUTANEOUS | Status: DC
  Administered 2015-02-26 – 2015-02-27 (×3): 40 mg via SUBCUTANEOUS
  Filled 2015-02-25 (×5): qty 0.4

## 2015-02-25 MED ORDER — POTASSIUM CHLORIDE 10 MEQ/100ML IV SOLN: 10.0000 meq | Freq: Once | INTRAVENOUS | Status: DC

## 2015-02-25 MED ORDER — POTASSIUM CHLORIDE 10 MEQ/100ML IV SOLN
10.0000 meq | INTRAVENOUS | Status: AC
  Administered 2015-02-25 (×2): 10 meq via INTRAVENOUS
  Filled 2015-02-25 (×2): qty 100

## 2015-02-25 MED ORDER — DEXTROSE-NACL 5-0.45 % IV SOLN
INTRAVENOUS | Status: DC
Start: 2015-02-25 — End: 2015-02-26
  Administered 2015-02-25: 100 mL/h via INTRAVENOUS
  Administered 2015-02-26: 02:00:00 via INTRAVENOUS

## 2015-02-25 MED ORDER — SODIUM CHLORIDE 0.9 % IV SOLN: INTRAVENOUS | Status: AC

## 2015-02-25 MED ORDER — LORAZEPAM 2 MG/ML IJ SOLN
1.0000 mg | INTRAMUSCULAR | Status: DC | PRN
  Administered 2015-02-25 – 2015-02-26 (×4): 1 mg via INTRAVENOUS
  Filled 2015-02-25 (×4): qty 1

## 2015-02-25 MED ORDER — LORAZEPAM 2 MG/ML IJ SOLN
1.0000 mg | INTRAMUSCULAR | Status: DC | PRN
  Administered 2015-02-25: 1 mg via INTRAVENOUS
  Filled 2015-02-25: qty 1

## 2015-02-25 MED ORDER — CHLORHEXIDINE GLUCONATE 0.12 % MT SOLN
15.0000 mL | Freq: Two times a day (BID) | OROMUCOSAL | Status: DC
  Administered 2015-02-25 – 2015-02-26 (×3): 15 mL via OROMUCOSAL
  Filled 2015-02-25 (×6): qty 15

## 2015-02-25 MED ORDER — SODIUM CHLORIDE 0.9 % IV SOLN
1000.0000 mL | Freq: Once | INTRAVENOUS | Status: AC
  Administered 2015-02-25: 1000 mL via INTRAVENOUS

## 2015-02-25 MED ORDER — POTASSIUM CHLORIDE 20 MEQ/15ML (10%) PO SOLN: 20.0000 meq | Freq: Every day | ORAL | Status: DC

## 2015-02-25 MED ORDER — SODIUM CHLORIDE 0.9 % IV SOLN: INTRAVENOUS | Status: DC

## 2015-02-25 MED ORDER — CETYLPYRIDINIUM CHLORIDE 0.05 % MT LIQD
7.0000 mL | Freq: Two times a day (BID) | OROMUCOSAL | Status: DC
  Administered 2015-02-25 – 2015-02-26 (×3): 7 mL via OROMUCOSAL

## 2015-02-25 MED ORDER — SODIUM CHLORIDE 0.9 % IV BOLUS (SEPSIS)
1000.0000 mL | Freq: Once | INTRAVENOUS | Status: AC
  Administered 2015-02-25: 1000 mL via INTRAVENOUS

## 2015-02-25 MED ORDER — LORAZEPAM 2 MG/ML IJ SOLN
0.5000 mg | Freq: Once | INTRAMUSCULAR | Status: AC
  Administered 2015-02-25: 0.5 mg via INTRAVENOUS
  Filled 2015-02-25: qty 1

## 2015-02-25 NOTE — ED Notes (Signed)
Blood was hemolyzed per lab, will reorder and redraw upstairs.  Pt grey necklace and black watch given to wife. Pt clothing given to wife

## 2015-02-25 NOTE — ED Notes (Signed)
PER [facility], pt was noticed by his wife at 5am to be sluggish answering questions, confused with answering questions, and "not acting himself". Per EMS, pt can give his name, slow to answer questions. Eyes are nonreactive, appear constricted, stroke screen negative by ems, no facial droop, no weakness, numbness. Pt denies drug/alcohol use. Pt diabetic, CBG 384. Pt LSN last night 10pm.

## 2015-02-25 NOTE — Progress Notes (Signed)
UR COMPLETED  

## 2015-02-25 NOTE — H&P (Signed)
[facility] History and Physical                                                                                    Fay Swider, is a 59 y.o. male  MRN: [id number]   DOB - November 10, 1955  Admit Date - 02/25/2015  Outpatient Primary MD for the patient is Maryelizabeth Rowan, MD  Referring MD: Juleen China / ER  With History of -  Past Medical History  Diagnosis Date  . Back pain   . Diabetes mellitus without complication   . Fibromyalgia   . Seizures   . Arthritis, bil knees with chronic pain 11/07/2013      Past Surgical History  Procedure Laterality Date  . Knee surgery      in for   Chief Complaint  Patient presents with  . Altered Mental Status     HPI This is a 59 year old patient with known history diabetes, questionable history of seizure disorder, as well as chronic back pain/fibromyalgia who was sent to the ER through EMS because of altered mentation. The patient's wife had noted he was confused around 5 AM this morning and was alert to person only. He remained profoundly confused so his wife had him brought to the hospital.  In the ER patient was afebrile, his initial respiratory rate was 32 with a heart rate of 100 and BP of 133/83 and room air saturation 93%. After patient settled into the ER his pulse decreased somewhat to 92 and blood pressure was 140/90 with a normal respiratory rate of 18. CT of the head without contrast was unremarkable for acute process. Portable chest x-ray revealed poor inspiratory effort and no other acute pulmonary process. Serum glucose was 405 with a CO2 of 19 and an anion gap of 18 therefore the patient was started on IV fluids and an insulin infusion for DKA. Troponin was negative, lactic acid was 1.85. CBC was negative except for mild thrombocytopenia of 147. White count was normal but patient did have mildly elevated neutrophil count of 82%. Coags were normal. Now systems was unremarkable except for glycosuria and positive ketones greater than  80 and urine specific gravity of 1.039. Urine drug screen was obtained that was positive for opiates noting based on patient's home medication list that has not been confirmed by pharmacy that he has been prescribed opiates as well as Lyrica in the past.  When I entered the patient's room for history and physical patient had completely undressed himself and had pulled out all his IVs with blood on the floor. He was sitting on the edge of the stretcher. No family was at the bedside. He was clearly very confused and inappropriate but was quite alert without any focal neurological deficits. Despite extensive prompting patient was unable to tell me that he was in the hospital or tell me where he was at he was only able to tell me that his name was Trino. Unable to obtain additional history due to patient's lack of ability to respond in setting of acute mental status changes.   Review of Systems   In addition to the HPI above,  Unable to obtain from patient due to  altered mentation as described above  *A full 10 point Review of Systems was done, except as stated above, all other Review of Systems were negative.  Social History History  Substance Use Topics  . Smoking status: Current Every Day Smoker -- 0.50 packs/day    Types: Cigarettes  . Smokeless tobacco: Never Used  . Alcohol Use: No    Resides at: Private residence  Lives with: Wife  Ambulatory status: Without assistive devices   Family History Family History  Problem Relation Age of Onset  . Diabetes Mother   . Leukemia Father 12  . Healthy Sister   . Healthy Brother   . Healthy Sister   . Healthy Brother   . Healthy Brother   . Healthy Brother      Prior to Admission medications   Medication Sig Start Date End Date Taking? Authorizing Provider  acetaminophen (TYLENOL) 325 MG tablet Take 2 tablets (650 mg total) by mouth every 4 (four) hours as needed for headache or mild pain. Patient not taking: Reported on 01/11/2015  11/08/13   Joline Salt Barrett, PA-C  aspirin EC 81 MG EC tablet Take 1 tablet (81 mg total) by mouth daily. Patient not taking: Reported on 11/19/2014 11/08/13   Joline Salt Barrett, PA-C  furosemide (LASIX) 20 MG tablet Take 1 tablet (20 mg total) by mouth daily. Patient not taking: Reported on 11/19/2014 04/20/14   Pricilla Loveless, MD  HYDROcodone-acetaminophen [facility]) 5-325 MG per tablet Take 1-2 tablets by mouth every 4 (four) hours as needed. Patient not taking: Reported on 01/11/2015 11/19/14   Blane Ohara, MD  HYDROcodone-acetaminophen (NORCO/VICODIN) 5-325 MG per tablet Take 1 tablet by mouth every 4 (four) hours as needed for moderate pain or severe pain. Patient not taking: Reported on 11/19/2014 04/20/14   Pricilla Loveless, MD  ibuprofen (ADVIL,MOTRIN) 200 MG tablet Take 600-800 mg by mouth every 6 (six) hours as needed for moderate pain.    Historical Provider, MD  insulin aspart (NOVOLOG) 100 UNIT/ML injection Inject 35 Units into the skin 3 (three) times daily before meals.     Historical Provider, MD  insulin glargine (LANTUS) 100 UNIT/ML injection Inject 75 Units into the skin every morning.     Historical Provider, MD  metFORMIN (GLUCOPHAGE) 1000 MG tablet Take 1,000 mg by mouth 2 (two) times daily with a meal.    Historical Provider, MD  oxymetazoline (AFRIN) 0.05 % nasal spray Place 1 spray into both nostrils every morning.    Historical Provider, MD  pregabalin (LYRICA) 300 MG capsule Take 300 mg by mouth 2 (two) times daily.     Historical Provider, MD    No Known Allergies  Physical Exam  Vitals  Blood pressure 130/79, pulse 102, temperature 98.9 F (37.2 C), resp. rate 18, SpO2 93 %.   General:  In no acute physical distress, appears healthy and well nourished  Psych:  Flat affect, Denies Suicidal or Homicidal ideations, Awake Alert, Oriented X 3 name only, unable to be re-oriented; easily agitated and impulsive  Neuro:   No focal neurological deficits, CN II through XII intact,  Strength 5/5 all 4 extremities, Sensation intact all 4 extremities.  ENT:  Ears and Eyes appear Normal, Conjunctivae clear, PER. Dry oral mucosa without erythema or exudates.  Neck:  Supple, No lymphadenopathy appreciated  Respiratory:  Symmetrical chest wall movement, Good air movement bilaterally, CTAB. Room Air  Cardiac:  RRR, No Murmurs, no LE edema noted, no JVD, No carotid bruits, peripheral pulses palpable at  2+  Abdomen:  Positive bowel sounds, Soft, Non tender, Non distended,  No masses appreciated, no obvious hepatosplenomegaly  Skin:  No Cyanosis, Normal Skin Turgor, No Skin Rash or Bruise. Bleeding from where he pulled out IV sites. Skin inspected and no obvious insect bites or ticks seen on patient  Extremities: Symmetrical without obvious trauma or injury,  no effusions.  Data Review  CBC  Recent Labs Lab 02/25/15 1031  WBC 7.9  HGB 16.8  HCT 49.8  PLT 147*  MCV 87.1  MCH 29.4  MCHC 33.7  RDW 14.5  LYMPHSABS 1.1  MONOABS 0.2  EOSABS 0.1  BASOSABS 0.0    Chemistries   Recent Labs Lab 02/25/15 1031  NA 141  K 4.2  CL 104  CO2 19*  GLUCOSE 405*  BUN 21*  CREATININE 0.96  CALCIUM 9.2  AST 13*  ALT 15*  ALKPHOS 78  BILITOT 1.1    CrCl cannot be calculated (Unknown ideal weight.).  No results for input(s): TSH, T4TOTAL, T3FREE, THYROIDAB in the last 72 hours.  Invalid input(s): FREET3  Coagulation profile  Recent Labs Lab 02/25/15 1031  INR 0.97    No results for input(s): DDIMER in the last 72 hours.  Cardiac Enzymes No results for input(s): CKMB, TROPONINI, MYOGLOBIN in the last 168 hours.  Invalid input(s): CK  Invalid input(s): POCBNP  Urinalysis    Component Value Date/Time   COLORURINE YELLOW 02/25/2015 1108   APPEARANCEUR CLEAR 02/25/2015 1108   LABSPEC 1.039* 02/25/2015 1108   PHURINE 5.0 02/25/2015 1108   GLUCOSEU >1000* 02/25/2015 1108   HGBUR NEGATIVE 02/25/2015 1108   BILIRUBINUR SMALL* 02/25/2015 1108    KETONESUR >80* 02/25/2015 1108   PROTEINUR NEGATIVE 02/25/2015 1108   UROBILINOGEN 0.2 02/25/2015 1108   NITRITE NEGATIVE 02/25/2015 1108   LEUKOCYTESUR NEGATIVE 02/25/2015 1108    Imaging results:   Dg Chest 2 View  02/25/2015   CLINICAL DATA:  Current smoker presenting with seizures and acute mental status changes with confusion. Hyperglycemia with blood glucose level 384, current history of diabetes.  EXAM: CHEST  2 VIEW  COMPARISON:  01/11/2015 and earlier.  FINDINGS: Respiratory motion blurs the lateral image. Suboptimal inspiration accounts for crowded bronchovascular markings diffusely and atelectasis in the bases, and accentuates the cardiac silhouette. Taking this into account, cardiac silhouette mildly enlarged, unchanged. Thoracic aorta mildly tortuous, unchanged. Hilar and mediastinal contours otherwise unremarkable. Lungs otherwise clear. No visible pleural effusions. Degenerative changes involving the thoracic and visualized upper lumbar spine.  IMPRESSION: Suboptimal inspiration accounts for bibasilar atelectasis. No acute cardiopulmonary disease otherwise. Stable mild cardiomegaly.   Electronically Signed   By: Hulan Saas M.D.   On: 02/25/2015 10:57   Ct Head Wo Contrast  02/25/2015   CLINICAL DATA:  Acute onset of confusion and altered mental status today.  EXAM: CT HEAD WITHOUT CONTRAST  TECHNIQUE: Contiguous axial images were obtained from the base of the skull through the vertex without intravenous contrast.  COMPARISON:  None.  FINDINGS: No evidence of intracranial hemorrhage, brain edema, or other signs of acute infarction. No evidence of intracranial mass lesion or mass effect.  No abnormal extraaxial fluid collections identified. Ventricles are normal in size. No skull abnormality identified.  IMPRESSION: Negative noncontrast head CT.   Electronically Signed   By: Myles Rosenthal M.D.   On: 02/25/2015 12:42     EKG: (Independently reviewed) sinus tachycardia without acute ST  or T-wave changes that would be concerning for ischemia, normal QTC  Assessment & Plan  Principal Problem:   Metabolic encephalopathy -Admit to stepdown -Etiology unclear: Differential includes metabolic from DKA, medication related, possible seizure. CT brain negative. Currently sedated after Ativan. Exam nonfocal. Treatment DKA, hold any sedating medications, check EEG  Active Problems:   DM type 2 /DKA -Continue insulin infusion until anion gap closed -Hold preadmission metformin; check CPK -Can resume Lantus once anion gap closed -Check hemoglobin A1c; was elevated at 10.9 and March 2015 -Last electrolytes panel greater than 4 hours ago so check stat now    ? Seizure disorder -Reported in history but patient not on chronic meds -No apparent reported post ictal phase but as precaution check EEG    Fibromyalgia/chronic back pain -Patient on max dose Lyrica prior to admission of 300 mg twice daily; previously had been on up to 300 mg 3 times a day -Given altered mentation currently holding all potentially sedative medications including Lyrica and narcotics    Dehydration, moderate -Continue IV fluids for now     DVT Prophylaxis: Lovenox  Family Communication:   No family at bedside  Code Status: Full code based on previous admission resuscitation status   Condition: Guarded    Discharge disposition: Pending resolution of DKA as well as altered mental status symptoms; will likely need PT/OT evaluation prior to discharge  Time spent in minutes : 60      ELLIS,ALLISON L. ANP on 02/25/2015 at 2:48 PM  Between 7am to 7pm - Pager - [phone]  After 7pm go to [email or web address] - password [email or web address]  And look for the night coverage person covering me after hours  [facility]   Attending note:  Patient examined. Chart reviewed. Above note amended. Discussed with Ms. Rennis Hardingllis. Suspect encephalopathy related to DKA and medication related, but cannot rule out seizures  or postictal state. EEG pending. Treat DKA monitor in stepdown.  Agitation improved after Ativan.  Crista Curborinna Manraj Yeo, MD [facility]

## 2015-02-25 NOTE — ED Notes (Signed)
317 CBG

## 2015-02-25 NOTE — ED Provider Notes (Signed)
CSN: [id number]     Arrival date & time 02/25/15  [id number] History   First MD Initiated Contact with Patient 02/25/15 [phone]     Chief Complaint  Patient presents with  . Altered Mental Status     (Consider location/radiation/quality/duration/timing/severity/associated sxs/prior Treatment) HPI Charles Blevins is a 59 year old male with past medical history of diabetes, seizures, back pain who presents the ER by EMS. EMS was called by patient's wife who noticed patient was confused when waking up this morning around 5 AM. EMS noted patient to be alert to person only, however responding to verbal commands well. On my examination, patient is fully alert, answering questions with response "no" to most of my questions, however inappropriately.. Patient can give me his full name, does not appear to have any slurred speech. Unable to perform ROS due to confusion.  Past Medical History  Diagnosis Date  . Back pain   . Diabetes mellitus without complication   . Fibromyalgia   . Seizures   . Arthritis, bil knees with chronic pain 11/07/2013   Past Surgical History  Procedure Laterality Date  . Knee surgery     Family History  Problem Relation Age of Onset  . Diabetes Mother   . Leukemia Father [id number]  . Healthy Sister   . Healthy Brother   . Healthy Sister   . Healthy Brother   . Healthy Brother   . Healthy Brother    History  Substance Use Topics  . Smoking status: Current Every Day Smoker -- 0.50 packs/day    Types: Cigarettes  . Smokeless tobacco: Never Used  . Alcohol Use: No    Review of Systems  Unable to perform ROS: Mental status change      Allergies  Review of patient's allergies indicates no known allergies.  Home Medications   Prior to Admission medications   Medication Sig Start Date End Date Taking? Authorizing Provider  acetaminophen (TYLENOL) 325 MG tablet Take 2 tablets (650 mg total) by mouth every 4 (four) hours as needed for headache or mild pain. Patient not  taking: Reported on 01/11/2015 11/08/13   Joline Salthonda G Barrett, PA-C  aspirin EC 81 MG EC tablet Take 1 tablet (81 mg total) by mouth daily. Patient not taking: Reported on 11/19/2014 11/08/13   Joline Salthonda G Barrett, PA-C  furosemide (LASIX) 20 MG tablet Take 1 tablet (20 mg total) by mouth daily. Patient not taking: Reported on 11/19/2014 04/20/14   Pricilla LovelessScott Goldston, MD  gabapentin (NEURONTIN) 600 MG tablet Take 1,200 mg by mouth 3 (three) times daily. 01/24/15   Historical Provider, MD  gabapentin (NEURONTIN) 600 MG tablet Take 1,200 mg by mouth 3 (three) times daily.  01/24/15   Historical Provider, MD  HYDROcodone-acetaminophen (NORCO) 5-325 MG per tablet Take 1-2 tablets by mouth every 4 (four) hours as needed. Patient not taking: Reported on 01/11/2015 11/19/14   Blane OharaJoshua Zavitz, MD  HYDROcodone-acetaminophen (NORCO/VICODIN) 5-325 MG per tablet Take 1 tablet by mouth every 4 (four) hours as needed for moderate pain or severe pain. Patient not taking: Reported on 11/19/2014 04/20/14   Pricilla LovelessScott Goldston, MD  ibuprofen (ADVIL,MOTRIN) 200 MG tablet Take 600-800 mg by mouth every 6 (six) hours as needed for moderate pain.    Historical Provider, MD  insulin aspart (NOVOLOG) 100 UNIT/ML injection Inject 35 Units into the skin 3 (three) times daily before meals.     Historical Provider, MD  insulin glargine (LANTUS) 100 UNIT/ML injection Inject 75 Units into the skin every morning.  Historical Provider, MD  LANTUS SOLOSTAR 100 UNIT/ML Solostar Pen Inject 55 Units into the skin daily. 01/30/15   Historical Provider, MD  LANTUS SOLOSTAR 100 UNIT/ML Solostar Pen  02/24/15   Historical Provider, MD  metFORMIN (GLUCOPHAGE) 1000 MG tablet Take 1,000 mg by mouth 2 (two) times daily with a meal.    Historical Provider, MD  oxymetazoline (AFRIN) 0.05 % nasal spray Place 1 spray into both nostrils every morning.    Historical Provider, MD  pregabalin (LYRICA) 300 MG capsule Take 300 mg by mouth 2 (two) times daily.     Historical Provider,  MD   BP 130/79 mmHg  Pulse 102  Temp(Src) 98.9 F (37.2 C)  Resp 18  SpO2 93% Physical Exam  Constitutional: He is oriented to person, place, and time. He appears well-developed and well-nourished. No distress.  HENT:  Head: Normocephalic and atraumatic.  Mouth/Throat: Oropharynx is clear and moist. No oropharyngeal exudate.  Eyes: Right eye exhibits no discharge. Left eye exhibits no discharge. No scleral icterus.  Neck: Normal range of motion.  Cardiovascular: Normal rate, regular rhythm and normal heart sounds.   No murmur heard. Pulmonary/Chest: Effort normal and breath sounds normal. No respiratory distress.  Abdominal: Soft. There is no tenderness.  Musculoskeletal: Normal range of motion. He exhibits no edema or tenderness.  Neurological: He is alert and oriented to person, place, and time. No cranial nerve deficit. Coordination normal. GCS eye subscore is 4. GCS verbal subscore is 3. GCS motor subscore is 6.  Patient fully alert, answering questions appropriately to person only. Patient can answer no to most questions, cannot answer yes/no questions appropriately. Cranial nerves II through XII grossly intact. Motor strength 5 out of 5 in all major muscle groups of upper and lower extremities. Distal sensation intact.   Skin: Skin is warm and dry. No rash noted. He is not diaphoretic.  Psychiatric: He has a normal mood and affect.  Nursing note and vitals reviewed.   ED Course  Procedures (including critical care time) Labs Review Labs Reviewed  CBC WITH DIFFERENTIAL/PLATELET - Abnormal; Notable for the following:    Platelets 147 (*)    Neutrophils Relative % 82 (*)    All other components within normal limits  COMPREHENSIVE METABOLIC PANEL - Abnormal; Notable for the following:    CO2 19 (*)    Glucose, Bld 405 (*)    BUN 21 (*)    AST 13 (*)    ALT 15 (*)    Anion gap 18 (*)    All other components within normal limits  URINALYSIS, ROUTINE W REFLEX MICROSCOPIC  (NOT AT [facility]) - Abnormal; Notable for the following:    Specific Gravity, Urine 1.039 (*)    Glucose, UA >1000 (*)    Bilirubin Urine SMALL (*)    Ketones, ur >80 (*)    All other components within normal limits  URINE MICROSCOPIC-ADD ON - Abnormal; Notable for the following:    Bacteria, UA FEW (*)    All other components within normal limits  URINE RAPID DRUG SCREEN, HOSP PERFORMED - Abnormal; Notable for the following:    Opiates POSITIVE (*)    All other components within normal limits  CBG MONITORING, ED - Abnormal; Notable for the following:    Glucose-Capillary 357 (*)    All other components within normal limits  CBG MONITORING, ED - Abnormal; Notable for the following:    Glucose-Capillary 317 (*)    All other components within normal limits  URINE CULTURE  MRSA PCR SCREENING  PROTIME-INR  BASIC METABOLIC PANEL  CK  AMMONIA  BASIC METABOLIC PANEL  BASIC METABOLIC PANEL  BASIC METABOLIC PANEL  MAGNESIUM  PHOSPHORUS  HEMOGLOBIN A1C  I-STAT CG4 LACTIC ACID, ED  Rosezena Sensor, ED    Imaging Review Dg Chest 2 View  02/25/2015   CLINICAL DATA:  Current smoker presenting with seizures and acute mental status changes with confusion. Hyperglycemia with blood glucose level 384, current history of diabetes.  EXAM: CHEST  2 VIEW  COMPARISON:  01/11/2015 and earlier.  FINDINGS: Respiratory motion blurs the lateral image. Suboptimal inspiration accounts for crowded bronchovascular markings diffusely and atelectasis in the bases, and accentuates the cardiac silhouette. Taking this into account, cardiac silhouette mildly enlarged, unchanged. Thoracic aorta mildly tortuous, unchanged. Hilar and mediastinal contours otherwise unremarkable. Lungs otherwise clear. No visible pleural effusions. Degenerative changes involving the thoracic and visualized upper lumbar spine.  IMPRESSION: Suboptimal inspiration accounts for bibasilar atelectasis. No acute cardiopulmonary disease otherwise.  Stable mild cardiomegaly.   Electronically Signed   By: Hulan Saas M.D.   On: 02/25/2015 10:57   Ct Head Wo Contrast  02/25/2015   CLINICAL DATA:  Acute onset of confusion and altered mental status today.  EXAM: CT HEAD WITHOUT CONTRAST  TECHNIQUE: Contiguous axial images were obtained from the base of the skull through the vertex without intravenous contrast.  COMPARISON:  None.  FINDINGS: No evidence of intracranial hemorrhage, brain edema, or other signs of acute infarction. No evidence of intracranial mass lesion or mass effect.  No abnormal extraaxial fluid collections identified. Ventricles are normal in size. No skull abnormality identified.  IMPRESSION: Negative noncontrast head CT.   Electronically Signed   By: Myles Rosenthal M.D.   On: 02/25/2015 12:42     EKG Interpretation   Date/Time:  Tuesday February 25 2015 09:41:51 EDT Ventricular Rate:  101 PR Interval:  154 QRS Duration: 88 QT Interval:  353 QTC Calculation: 457 R Axis:   2 Text Interpretation:  Sinus tachycardia Low voltage, precordial leads  Borderline T abnormalities, anterior leads Confirmed by Juleen China  MD, STEPHEN  ([id number]) on 02/25/2015 10:00:43 AM      MDM   Final diagnoses:  None   Patient here with altered mental status. GCS of approximately 13 throughout stay here. Patient able to answer his name appropriately only. Patient can respond to verbal commands well, no other neurologic deficit noted on exam. Lab work remarkable for hyperglycemia and anion gap elevation of 18, ketonuria, decreased CO2 at 19. This is concerning for DKA. Patient agitated throughout his stay here, continues to take out peripheral IVs. Patient given Ativan to help with agitation. CT head without evidence of acute abnormalities. Likely altered mental status is secondary to metabolic abnormalities versus neurologic. We'll admit this patient for DKA, as well as altered mental status. The patient appears reasonably stabilized for admission  considering the current resources, flow, and capabilities available in the ED at this time, and I doubt any other [facility] requiring further screening and/or treatment in the ED prior to admission.  BP 130/79 mmHg  Pulse 102  Temp(Src) 98.9 F (37.2 C)  Resp 18  SpO2 93%  Signed,  Ladona Mow, PA-C 4:04 PM  Patient seen and discussed with Dr. Raeford Razor, MD    Ladona Mow, PA-C 02/25/15 1604  Raeford Razor, MD 02/28/15 1436

## 2015-02-26 ENCOUNTER — Inpatient Hospital Stay (HOSPITAL_COMMUNITY): Payer: 59

## 2015-02-26 DIAGNOSIS — G9341 Metabolic encephalopathy: Principal | ICD-10-CM

## 2015-02-26 LAB — GLUCOSE, CAPILLARY
GLUCOSE-CAPILLARY: 115 mg/dL — AB (ref 65–99)
GLUCOSE-CAPILLARY: 120 mg/dL — AB (ref 65–99)
GLUCOSE-CAPILLARY: 156 mg/dL — AB (ref 65–99)
GLUCOSE-CAPILLARY: 69 mg/dL (ref 65–99)
GLUCOSE-CAPILLARY: 84 mg/dL (ref 65–99)
Glucose-Capillary: 103 mg/dL — ABNORMAL HIGH (ref 65–99)
Glucose-Capillary: 127 mg/dL — ABNORMAL HIGH (ref 65–99)
Glucose-Capillary: 166 mg/dL — ABNORMAL HIGH (ref 65–99)
Glucose-Capillary: 117 mg/dL — ABNORMAL HIGH (ref 65–99)
Glucose-Capillary: 100 mg/dL — ABNORMAL HIGH (ref 65–99)
Glucose-Capillary: 89 mg/dL (ref 65–99)
Glucose-Capillary: 70 mg/dL (ref 65–99)

## 2015-02-26 LAB — BASIC METABOLIC PANEL
Anion gap: 6 (ref 5–15)
BUN: 19 mg/dL (ref 6–20)
CALCIUM: 8.8 mg/dL — AB (ref 8.9–10.3)
CO2: 28 mmol/L (ref 22–32)
CREATININE: 0.63 mg/dL (ref 0.61–1.24)
Chloride: 111 mmol/L (ref 101–111)
GFR calc non Af Amer: >60 mL/min (ref >60)
Glucose, Bld: 103 mg/dL — ABNORMAL HIGH (ref 65–99)
POTASSIUM: 3.4 mmol/L — AB (ref 3.5–5.1)
Sodium: 145 mmol/L (ref 135–145)

## 2015-02-26 LAB — HEMOGLOBIN A1C
Hgb A1c MFr Bld: 13.6 % — ABNORMAL HIGH (ref 4.8–5.6)
Mean Plasma Glucose: 344 mg/dL

## 2015-02-26 LAB — VITAMIN B12: VITAMIN B 12: 362 pg/mL (ref 180–914)

## 2015-02-26 LAB — URINE CULTURE: Culture: NO GROWTH

## 2015-02-26 LAB — FOLATE: Folate: 27 ng/mL (ref >5.9)

## 2015-02-26 LAB — TSH: TSH: 0.514 u[IU]/mL (ref 0.350–4.500)

## 2015-02-26 MED ORDER — INSULIN GLARGINE 100 UNIT/ML ~~LOC~~ SOLN: 50.0000 [IU] | Freq: Every day | SUBCUTANEOUS | Status: DC

## 2015-02-26 MED ORDER — POTASSIUM CHLORIDE 10 MEQ/100ML IV SOLN
10.0000 meq | INTRAVENOUS | Status: AC
  Administered 2015-02-26 (×3): 10 meq via INTRAVENOUS
  Filled 2015-02-26 (×3): qty 100

## 2015-02-26 MED ORDER — INSULIN GLARGINE 100 UNIT/ML ~~LOC~~ SOLN
50.0000 [IU] | Freq: Every day | SUBCUTANEOUS | Status: DC
  Administered 2015-02-26: 50 [IU] via SUBCUTANEOUS
  Filled 2015-02-26: qty 0.5

## 2015-02-26 MED ORDER — INSULIN ASPART 100 UNIT/ML ~~LOC~~ SOLN
0.0000 [IU] | SUBCUTANEOUS | Status: DC
  Administered 2015-02-26: 3 [IU] via SUBCUTANEOUS
  Administered 2015-02-26 (×3): 0 [IU] via SUBCUTANEOUS
  Administered 2015-02-27: 3 [IU] via SUBCUTANEOUS

## 2015-02-26 MED ORDER — SODIUM CHLORIDE 0.9 % IV SOLN
INTRAVENOUS | Status: DC
  Administered 2015-02-26: 1000 mL via INTRAVENOUS
  Administered 2015-02-27: 21:00:00 via INTRAVENOUS

## 2015-02-26 MED ORDER — LORAZEPAM 2 MG/ML IJ SOLN: 1.0000 mg | Freq: Once | INTRAMUSCULAR | Status: DC

## 2015-02-26 MED ORDER — POTASSIUM CHLORIDE CRYS ER 20 MEQ PO TBCR: 40.0000 meq | EXTENDED_RELEASE_TABLET | Freq: Once | ORAL | Status: DC

## 2015-02-26 MED ORDER — CYANOCOBALAMIN 1000 MCG/ML IJ SOLN
1000.0000 ug | Freq: Every day | INTRAMUSCULAR | Status: DC
  Administered 2015-02-26 – 2015-02-27 (×2): 1000 ug via SUBCUTANEOUS
  Filled 2015-02-26 (×4): qty 1

## 2015-02-26 MED ORDER — SODIUM CHLORIDE 0.9 % IV SOLN
1000.0000 mL | Freq: Once | INTRAVENOUS | Status: AC
  Administered 2015-02-26: 1000 mL via INTRAVENOUS

## 2015-02-26 MED ORDER — POTASSIUM CHLORIDE 10 MEQ/100ML IV SOLN
10.0000 meq | INTRAVENOUS | Status: AC
  Administered 2015-02-26 (×2): 10 meq via INTRAVENOUS
  Filled 2015-02-26 (×2): qty 100

## 2015-02-26 NOTE — Progress Notes (Signed)
Charles Blevins - Stepdown/ICU TEAM Progress Note  Charles Blevins ZOX:[id number] DOB: 01/22/1956 DOA: 02/25/2015 PCP: Charles Blevins,[name], MD  Admit HPI / Brief Narrative: 59 year old patient with known history diabetes, questionable history of seizure disorder, as well as chronic back pain/fibromyalgia who was sent to the ER via EMS because of altered mentation. The patient's wife had noted he was confused around 5 AM / alert to person only. He remained profoundly confused so his wife had him brought to the hospital.  In the ER patient was afebrile.  Chest x-ray revealed no acute pulmonary process. Serum glucose was 405 with a CO2 of 19 and an anion gap of 18 therefore the patient was started on IV fluids and an insulin infusion for DKA. Troponin was negative, lactic acid was Blevins.85. CBC was negative. Urine drug screen was positive for opiates noting he has been prescribed opiates.  HPI/Subjective: The patient is awake but remains severely encephalopathic.  Reflex speech such as "good morning" is intermittently intact but otherwise the patient does not speak at all.  He does not follow simple commands.  He seems somewhat agitated and is fidgeting about in the bed frequently changing his position.  He cannot provide a review of systems due to his encephalopathy.  His daughter is at the bedside and states that he has not improved/changed over the last 24 hours or in fact may actually be slightly more confused at this time than yesterday evening.  Assessment/Plan:  Metabolic encephalopathy Etiology unclear  - DKA resolved with no improvement in mental status suggesting that this is not the cause - CT brain negative - EEG pending - ammonia normal - I'm becoming more concerned this could represent a CVA - if the patient's mental status does not improve over the next few hours I will proceed with sedation and MRI - I have discussed my hesitancy to sedate the patient with his daughter and she also understands  that at this stage and intervention for a stroke would not be possible - check B12, folate, TSH, RPR  Uncontrolled DM type 2 / DKA CBG now well controlled - bicarb normalized and anion gap closed/normal - has been weaned from insulin gtt - follow trend - Ac1 pending   ? Seizure disorder Reported in history but patient not on chronic meds - EEG pending - perhaps his confusion could be post ictal but I would certainly expect that there would be at least some improvement at this point  Fibromyalgia / chronic back pain -Patient on max dose Lyrica prior to admission at 300 mg twice daily - holding all potentially sedative medications including Lyrica and narcotics  Dehydration, moderate Appears volume resuscitated at this time  Mild hypokalemia Due to tx of DKA / IV insulin - replace and follow   Code Status: FULL Family Communication: Spoke with patient's daughter at bedside at length Disposition Plan: SDU  Consultants: none  Procedures: EEG - pending   Antibiotics: none  DVT prophylaxis: lovenox   Objective: Blood pressure 116/67, pulse 74, temperature 98.9 F (37.2 C), temperature source Oral, resp. rate 19, height 5\' 7"  (Blevins.702 m), weight 96.6 kg (212 lb 15.4 oz), SpO2 94 %.  Intake/Output Summary (Last 24 hours) at 02/26/15 0953 Last data filed at 02/26/15 0800  Gross per 24 hour  Intake 1730.8 ml  Output    500 ml  Net 1230.8 ml   Exam: General: No acute respiratory distress Lungs: Clear to auscultation bilaterally without wheezes or crackles Cardiovascular: Regular rate and  rhythm without murmur gallop or rub normal S1 and S2 Abdomen: Nontender, nondistended, soft, bowel sounds positive, no rebound, no ascites, no appreciable mass Extremities: No significant cyanosis, clubbing, or edema bilateral lower extremities Neuro:  The patient is moving all 4 extremities spontaneously but does not follow simple commands, cranial nerves II through XII are intact bilaterally as  best as can be appreciated without the patient cooperating with the exam - his pupils are equal round and reactive to light  Data Reviewed: Basic Metabolic Panel:  Recent Labs Lab 02/25/15 1031 02/25/15 1530 02/25/15 1858 02/25/15 2204 02/26/15 0131  NA 141 144 144 144 145  K 4.2 4.Blevins 3.8 3.8 3.4*  CL 104 107 111 110 111  CO2 19* GLUCOSE 405* 317* 184* 175* 103*  BUN 21* CREATININE 0.96 0.91 0.71 0.71 0.63  CALCIUM 9.2 8.9 8.8* 8.9 8.8*  MG  --   --  Blevins.8  --   --   PHOS  --   --  Blevins.2*  --   --     CBC:  Recent Labs Lab 02/25/15 1031  WBC 7.9  NEUTROABS 6.5  HGB 16.8  HCT 49.8  MCV 87.Blevins  PLT 147*    Liver Function Tests:  Recent Labs Lab 02/25/15 1031  AST 13*  ALT 15*  ALKPHOS 78  BILITOT Blevins.Blevins  PROT 7.3  ALBUMIN 3.7    Recent Labs Lab 02/25/15 1530  AMMONIA 16    Coags:  Recent Labs Lab 02/25/15 1031  INR 0.97    Cardiac Enzymes:  Recent Labs Lab 02/25/15 1530  CKTOTAL 38*    CBG:  Recent Labs Lab 02/26/15 0329 02/26/15 0438 02/26/15 0540 02/26/15 0655 02/26/15 0803  GLUCAP 127* 166* 156* 117* 84    Recent Results (from the past 240 hour(s))  MRSA PCR Screening     Status: None   Collection Time: 02/25/15  3:20 PM  Result Value Ref Range Status   MRSA by PCR NEGATIVE NEGATIVE Final    Comment:        The GeneXpert MRSA Assay (FDA approved for NASAL specimens only), is one component of a comprehensive MRSA colonization surveillance program. It is not intended to diagnose MRSA infection nor to guide or monitor treatment for MRSA infections.      Studies:   Recent x-ray studies have been reviewed in detail by the Attending Physician  Scheduled Meds:  Scheduled Meds: . antiseptic oral rinse  7 mL Mouth Rinse q12n4p  . chlorhexidine  15 mL Mouth Rinse BID  . enoxaparin (LOVENOX) injection  40 mg Subcutaneous Q24H  . insulin aspart  0-15 Units Subcutaneous 6 times per day  . insulin  glargine  50 Units Subcutaneous QHS    Time spent on care of this patient: 35 mins   Rahel Carlton T , MD   [facility] Office  [phone] Pager - Text Page per Loretha Stapler as per below:  On-Call/Text Page:      [email or web address]      password [email or web address]  If 7PM-7AM, please contact night-coverage [email or web address] Password [email or web address] 02/26/2015, 9:53 AM   LOS: Blevins day

## 2015-02-26 NOTE — Progress Notes (Signed)
EEG completed; results pending.    

## 2015-02-26 NOTE — Procedures (Signed)
ELECTROENCEPHALOGRAM REPORT   Patient: Charles Blevins       Room #: 3S-12 Age: 59 y.o.        Sex: male Referring Physician: Dr Sharon SellerMcClung Report Date:  02/26/2015        Interpreting Physician: Omelia BlackwaterSUMNER, Annajulia Lewing JUSTIN  History: Charles Blevins is an 59 y.o. male admitted with altered mental status  Medications:  Scheduled: . antiseptic oral rinse  7 mL Mouth Rinse q12n4p  . chlorhexidine  15 mL Mouth Rinse BID  . enoxaparin (LOVENOX) injection  40 mg Subcutaneous Q24H  . insulin aspart  0-15 Units Subcutaneous 6 times per day  . LORazepam  1 mg Intravenous Once  . potassium chloride  10 mEq Intravenous Q1 Hr x 3    Conditions of Recording:  This is a 16 channel EEG carried out with the patient in the awake state.  Description:  The waking background activity consists predominantly of low voltage theta activity with brief delta activity mixed in. No focal slowing or epileptiform activity noted. Normal sleep architecture not observed.   Hyperventilation was not performed. Intermittent photic stimulation not observed.   IMPRESSION: This is an abnormal EEG secondary to general background slowing.  This finding may be seen with a diffuse disturbance that is etiologically nonspecific, but may include a metabolic encephalopathy, among other possibilities.  No epileptiform activity was noted.    Elspeth Choeter Tatym Schermer, DO [facility] [phone]  If 7pm- 7am, please page neurology on call as listed in AMION. 02/26/2015, 2:13 PM

## 2015-02-26 NOTE — Progress Notes (Signed)
Pt came down to MRI after being given ativan, pt still not cooperative, Dr Dolphus JennyMcLung paged, said if they decide they need it bad enough it will be worked up to be scanned under anesthesia.

## 2015-02-27 ENCOUNTER — Inpatient Hospital Stay (HOSPITAL_COMMUNITY): Payer: 59 | Admitting: Certified Registered Nurse Anesthetist

## 2015-02-27 DIAGNOSIS — M797 Fibromyalgia: Secondary | ICD-10-CM

## 2015-02-27 DIAGNOSIS — E1310 Other specified diabetes mellitus with ketoacidosis without coma: Secondary | ICD-10-CM

## 2015-02-27 DIAGNOSIS — M549 Dorsalgia, unspecified: Secondary | ICD-10-CM

## 2015-02-27 DIAGNOSIS — E1165 Type 2 diabetes mellitus with hyperglycemia: Secondary | ICD-10-CM

## 2015-02-27 DIAGNOSIS — G40909 Epilepsy, unspecified, not intractable, without status epilepticus: Secondary | ICD-10-CM

## 2015-02-27 DIAGNOSIS — IMO0002 Reserved for concepts with insufficient information to code with codable children: Secondary | ICD-10-CM | POA: Diagnosis present

## 2015-02-27 DIAGNOSIS — G8929 Other chronic pain: Secondary | ICD-10-CM

## 2015-02-27 DIAGNOSIS — E860 Dehydration: Secondary | ICD-10-CM

## 2015-02-27 LAB — CBC
HCT: 44.2 % (ref 39.0–52.0)
Hemoglobin: 14.7 g/dL (ref 13.0–17.0)
MCH: 28.9 pg (ref 26.0–34.0)
MCHC: 33.3 g/dL (ref 30.0–36.0)
MCV: 86.8 fL (ref 78.0–100.0)
Platelets: 149 10*3/uL — ABNORMAL LOW (ref 150–400)
RBC: 5.09 MIL/uL (ref 4.22–5.81)
RDW: 14.3 % (ref 11.5–15.5)
WBC: 7.3 10*3/uL (ref 4.0–10.5)

## 2015-02-27 LAB — COMPREHENSIVE METABOLIC PANEL
ALK PHOS: 60 U/L (ref 38–126)
ALT: 13 U/L — ABNORMAL LOW (ref 17–63)
ANION GAP: 8 (ref 5–15)
AST: 16 U/L (ref 15–41)
Albumin: 2.9 g/dL — ABNORMAL LOW (ref 3.5–5.0)
BUN: 16 mg/dL (ref 6–20)
CHLORIDE: 111 mmol/L (ref 101–111)
CO2: 23 mmol/L (ref 22–32)
Calcium: 8.4 mg/dL — ABNORMAL LOW (ref 8.9–10.3)
Creatinine, Ser: 0.67 mg/dL (ref 0.61–1.24)
GFR calc Af Amer: >60 mL/min (ref >60)
GFR calc non Af Amer: >60 mL/min (ref >60)
Glucose, Bld: 73 mg/dL (ref 65–99)
Potassium: 3 mmol/L — ABNORMAL LOW (ref 3.5–5.1)
Sodium: 142 mmol/L (ref 135–145)
Total Bilirubin: 1 mg/dL (ref 0.3–1.2)
Total Protein: 5.9 g/dL — ABNORMAL LOW (ref 6.5–8.1)

## 2015-02-27 LAB — GLUCOSE, CAPILLARY
GLUCOSE-CAPILLARY: 126 mg/dL — AB (ref 65–99)
GLUCOSE-CAPILLARY: 156 mg/dL — AB (ref 65–99)
Glucose-Capillary: 107 mg/dL — ABNORMAL HIGH (ref 65–99)
Glucose-Capillary: 63 mg/dL — ABNORMAL LOW (ref 65–99)
Glucose-Capillary: 85 mg/dL (ref 65–99)

## 2015-02-27 LAB — RPR: RPR: NONREACTIVE

## 2015-02-27 MED ORDER — INSULIN ASPART 100 UNIT/ML ~~LOC~~ SOLN
0.0000 [IU] | Freq: Three times a day (TID) | SUBCUTANEOUS | Status: DC
  Administered 2015-02-28: 3 [IU] via SUBCUTANEOUS

## 2015-02-27 MED ORDER — POTASSIUM CHLORIDE 10 MEQ/100ML IV SOLN
10.0000 meq | INTRAVENOUS | Status: AC
  Administered 2015-02-27 (×3): 10 meq via INTRAVENOUS
  Filled 2015-02-27: qty 100

## 2015-02-27 MED ORDER — DEXTROSE 50 % IV SOLN
INTRAVENOUS | Status: AC
  Administered 2015-02-27: 25 mL
  Filled 2015-02-27: qty 50

## 2015-02-27 NOTE — Progress Notes (Signed)
Hypoglycemic Event  CBG: 63  Treatment: 25mL D50 IV  Symptoms: none  Follow-up CBG: Time: 0300 CBG Result: 126  Possible Reasons for Event: NPO  Comments/MD notified: K. Schorr    Charles Blevins A  Remember to initiate Hypoglycemia Order Set & complete

## 2015-02-27 NOTE — Progress Notes (Signed)
[facility] TEAM 1 - Stepdown/ICU TEAM Progress Note  Marky Buresh MWN:[id number] DOB: 03-Sep-1955 DOA: 02/25/2015 PCP: Maryelizabeth Rowan, MD  Admit HPI / Brief Narrative: 59 year old WM PMHx  Diabetes Type 2 Uncontrolled, Seizure DO, Chronic Back Pain/Fibromyalgia  Sent to the ER via EMS because of altered mentation. The patient's wife had noted he was confused around 5 AM / alert to person only. He remained profoundly confused so his wife had him brought to the hospital.  In the ER patient was afebrile. Chest x-ray revealed no acute pulmonary process. Serum glucose was 405 with a CO2 of 19 and an anion gap of 18 therefore the patient was started on IV fluids and an insulin infusion for DKA. Troponin was negative, lactic acid was 1.85. CBC was negative. Urine drug screen was positive for opiates noting he has been prescribed opiates.   HPI/Subjective: 7/14 A/O 3 (does not know why), patient follows all commands has passes swallow test.  Assessment/Plan: Metabolic encephalopathy -Most likely multifactorial to include DKA, metabolic encephalopathy.  -Patient's cognition has improved with resolution of DKA.  -EEG; abnormal, however did not show seizure activity see results below  - CT brain negative; less concerned at this point of a CVA. After a long talk with patient very reluctant to sedate patient for an MRI when signs and symptoms appear to be abating. -PT/OT consult placed - B12, folate; WNL -TSH, RPR; WNL   Uncontrolled DM type 2 / DKA -CBG now well controlled  - 7/12 hemoglobin A1c = 13.6  -Continue moderate SSI -Patient still not consuming much food will have to start regimen as he begins to consume more nutrition.  Seizure disorder -Reported in history but patient not on chronic meds - EEG negative would not start medication at this time. If patient has another witnessed seizure would then start seizure protocol.   Fibromyalgia / chronic back pain -Patient on max dose  Lyrica prior to admission at 300 mg twice daily; holding all sedating medication  Dehydration, moderate -Appears volume resuscitated at this time  Mild hypokalemia -Potassium IV 10 mEq 3 runs  -Repeat potassium and magnesium level  -Due to tx of DKA / IV insulin - replace and follow    Code Status: FULL Family Communication: no family present at time of exam Disposition Plan: SNF vs HH vs Home    Consultants: Dr.Peter Cristine Polio (neurology)    Procedure/Significant Events: 7/12 CT head without contrast;Negative noncontrast head CT 7/13 EEG; abnormal, metabolic encephalopathy? Negative epileptiform activity  Culture   Antibiotics:   DVT prophylaxis: Lovenox   Devices    LINES / TUBES:      Continuous Infusions: . sodium chloride 1,000 mL (02/26/15 1122)    Objective: VITAL SIGNS: Temp: 97.9 F (36.6 C) (07/14 1100) Temp Source: Oral (07/14 1100) SPO2; FIO2:   Intake/Output Summary (Last 24 hours) at 02/27/15 1622 Last data filed at 02/27/15 0600  Gross per 24 hour  Intake   1058 ml  Output   1100 ml  Net    -42 ml     Exam: General:  A/O 3, (does not know why ), NAD, No acute respiratory distress Eyes: Negative headache, eye pain, double vision, negative scleral hemorrhage ENT: Negative Runny nose, negative ear pain, negative tinnitus, negative gingival bleeding Neck:  Negative scars, masses, torticollis, lymphadenopathy, JVD Lungs: Clear to auscultation bilaterally without wheezes or crackles Cardiovascular: Regular rate and rhythm without murmur gallop or rub normal S1 and S2 Abdomen:negative abdominal pain, negative dysphagia, Nontender, nondistended,  soft, bowel sounds positive, no rebound, no ascites, no appreciable mass Extremities: No significant cyanosis, clubbing, or edema bilateral lower extremities Psychiatric:  Negative depression, negative anxiety, negative fatigue, negative mania  Neurologic:  Cranial nerves II through XII  intact, tongue/uvula midline, all extremities muscle strength 5/5, sensation intact throughout, negative dysarthria, negative expressive aphasia, negative receptive aphasia. Did not ambulate patient   Data Reviewed: Basic Metabolic Panel:  Recent Labs Lab 02/25/15 1530 02/25/15 1858 02/25/15 2204 02/26/15 0131 02/27/15 0249  NA 144 144 144 145 142  K 4.1 3.8 3.8 3.4* 3.0*  CL 107 111 110 111 111  CO2 23 26 23 28 23   GLUCOSE 317* 184* 175* 103* 73  BUN 19 19 20 19 16   CREATININE 0.91 0.71 0.71 0.63 0.67  CALCIUM 8.9 8.8* 8.9 8.8* 8.4*  MG  --  1.8  --   --   --   PHOS  --  1.2*  --   --   --    Liver Function Tests:  Recent Labs Lab 02/25/15 1031 02/27/15 0249  AST 13* 16  ALT 15* 13*  ALKPHOS 78 60  BILITOT 1.1 1.0  PROT 7.3 5.9*  ALBUMIN 3.7 2.9*   No results for input(s): LIPASE, AMYLASE in the last 168 hours.  Recent Labs Lab 02/25/15 1530  AMMONIA 16   CBC:  Recent Labs Lab 02/25/15 1031 02/27/15 0249  WBC 7.9 7.3  NEUTROABS 6.5  --   HGB 16.8 14.7  HCT 49.8 44.2  MCV 87.1 86.8  PLT 147* 149*   Cardiac Enzymes:  Recent Labs Lab 02/25/15 1530  CKTOTAL 38*   BNP (last 3 results) No results for input(s): BNP in the last 8760 hours.  ProBNP (last 3 results) No results for input(s): PROBNP in the last 8760 hours.  CBG:  Recent Labs Lab 02/26/15 2111 02/26/15 2358 02/27/15 0425 02/27/15 0503 02/27/15 1205  GLUCAP 70 85 63* 126* 107*    Recent Results (from the past 240 hour(s))  Urine culture     Status: None   Collection Time: 02/25/15 11:08 AM  Result Value Ref Range Status   Specimen Description URINE, RANDOM  Final   Special Requests NONE  Final   Culture NO GROWTH 1 DAY  Final   Report Status 02/26/2015 FINAL  Final  MRSA PCR Screening     Status: None   Collection Time: 02/25/15  3:20 PM  Result Value Ref Range Status   MRSA by PCR NEGATIVE NEGATIVE Final    Comment:        The GeneXpert MRSA Assay (FDA approved for  NASAL specimens only), is one component of a comprehensive MRSA colonization surveillance program. It is not intended to diagnose MRSA infection nor to guide or monitor treatment for MRSA infections.      Studies:  Recent x-ray studies have been reviewed in detail by the Attending Physician  Scheduled Meds:  Scheduled Meds: . cyanocobalamin  1,000 mcg Subcutaneous Q2000  . enoxaparin (LOVENOX) injection  40 mg Subcutaneous Q24H  . insulin aspart  0-15 Units Subcutaneous 6 times per day  . LORazepam  1 mg Intravenous Once    Time spent on care of this patient: 40 mins   Ravenna Legore, Roselind MessierURTIS J , MD  [facility] Office  [phone] Pager [phone]- [phone]  On-Call/Text Page:      [email or web address]      password [email or web address]  If 7PM-7AM, please contact night-coverage [email or web address] Password [email or web address] 02/27/2015, 4:22 PM   LOS: 2  days   Care during the described time interval was provided by me .  I have reviewed this patient's available data, including medical history, events of note, physical examination, and all test results as part of my evaluation. I have personally reviewed and interpreted all radiology studies.   Dia Crawford, MD [phone] Pager

## 2015-02-27 NOTE — Evaluation (Signed)
Clinical/Bedside Swallow Evaluation Patient Details  Name: Schuyler Amorrnest Deemer MRN: [id number] Date of Birth: 09/05/1955  Today's Date: 02/27/2015 Time: SLP Start Time (ACUTE ONLY): 1030 SLP Stop Time (ACUTE ONLY): 1040 SLP Time Calculation (min) (ACUTE ONLY): 10 min  Past Medical History:  Past Medical History  Diagnosis Date  . Back pain   . Diabetes mellitus without complication   . Fibromyalgia   . Seizures   . Arthritis, bil knees with chronic pain 11/07/2013   Past Surgical History:  Past Surgical History  Procedure Laterality Date  . Knee surgery     HPI:  59 year old patient with known history diabetes, questionable history of seizure disorder, as well as chronic back pain/fibromyalgia who was sent to the ER via EMS because of altered mentation.  Remains encephalopathic; CCT negative.  MRI pending.    Assessment / Plan / Recommendation Clinical Impression  Pt presents with normal oropharyngeal swallow with adequate mastication, brisk swallow response, and no s/s of aspiration.  Recommend resuming regular diet, thin liquids.  No SLP f/u needed.     Aspiration Risk  Mild    Diet Recommendation  (regular, thin liqudis)   Medication Administration: Whole meds with liquid    Other  Recommendations Oral Care Recommendations: Oral care BID       Swallow Study Prior Functional Status       General Date of Onset: 02/25/15 Other Pertinent Information: 59 year old patient with known history diabetes, questionable history of seizure disorder, as well as chronic back pain/fibromyalgia who was sent to the ER via EMS because of altered mentation.  Remains encephalopathic; CCT negative.  MRI pending.  Type of Study: Bedside swallow evaluation Previous Swallow Assessment: no Diet Prior to this Study: NPO Temperature Spikes Noted: No Respiratory Status: Room air History of Recent Intubation: No Behavior/Cognition: Confused Oral Cavity - Dentition: Edentulous (no  uppers) Self-Feeding Abilities: Able to feed self Patient Positioning: Upright in bed Baseline Vocal Quality: Normal Volitional Cough: Strong Volitional Swallow: Able to elicit    Oral/Motor/Sensory Function Overall Oral Motor/Sensory Function: Appears within functional limits for tasks assessed   Ice Chips Ice chips: Within functional limits Presentation: Spoon   Thin Liquid Thin Liquid: Within functional limits Presentation: Cup    Nectar Thick Nectar Thick Liquid: Not tested   Honey Thick Honey Thick Liquid: Not tested   Puree Puree: Within functional limits Presentation: Self Fed;Spoon   Solid   Lindey Renzulli L. [place], [place] CCC/SLP Pager [phone]     Solid: Within functional limits Presentation: Self Fed       Blenda MountsCouture, Shantera Monts Laurice 02/27/2015,10:47 AM

## 2015-02-28 ENCOUNTER — Encounter (HOSPITAL_COMMUNITY)
Admission: EM | Disposition: A | Discharge status: Home / Self Care | Payer: Self-pay | Source: Home / Self Care | Attending: Internal Medicine

## 2015-02-28 LAB — COMPREHENSIVE METABOLIC PANEL
ALT: 12 U/L — AB (ref 17–63)
ANION GAP: 9 (ref 5–15)
AST: 14 U/L — ABNORMAL LOW (ref 15–41)
Albumin: 3 g/dL — ABNORMAL LOW (ref 3.5–5.0)
Alkaline Phosphatase: 59 U/L (ref 38–126)
BUN: 11 mg/dL (ref 6–20)
CALCIUM: 8.4 mg/dL — AB (ref 8.9–10.3)
CO2: 23 mmol/L (ref 22–32)
Chloride: 107 mmol/L (ref 101–111)
Creatinine, Ser: 0.73 mg/dL (ref 0.61–1.24)
GFR calc Af Amer: >60 mL/min (ref >60)
GFR calc non Af Amer: >60 mL/min (ref >60)
Glucose, Bld: 143 mg/dL — ABNORMAL HIGH (ref 65–99)
Potassium: 3.4 mmol/L — ABNORMAL LOW (ref 3.5–5.1)
SODIUM: 139 mmol/L (ref 135–145)
Total Bilirubin: 1.4 mg/dL — ABNORMAL HIGH (ref 0.3–1.2)
Total Protein: 6 g/dL — ABNORMAL LOW (ref 6.5–8.1)

## 2015-02-28 LAB — CBC WITH DIFFERENTIAL/PLATELET
Basophils Absolute: 0 10*3/uL (ref 0.0–0.1)
Basophils Relative: 1 % (ref 0–1)
EOS ABS: 0.1 10*3/uL (ref 0.0–0.7)
EOS PCT: 2 % (ref 0–5)
HCT: 46.3 % (ref 39.0–52.0)
Hemoglobin: 15.6 g/dL (ref 13.0–17.0)
Lymphocytes Relative: 24 % (ref 12–46)
Lymphs Abs: 1.6 10*3/uL (ref 0.7–4.0)
MCH: 28.9 pg (ref 26.0–34.0)
MCHC: 33.7 g/dL (ref 30.0–36.0)
MCV: 85.9 fL (ref 78.0–100.0)
MONOS PCT: 7 % (ref 3–12)
Monocytes Absolute: 0.5 10*3/uL (ref 0.1–1.0)
NEUTROS ABS: 4.5 10*3/uL (ref 1.7–7.7)
Neutrophils Relative %: 66 % (ref 43–77)
Platelets: 158 10*3/uL (ref 150–400)
RBC: 5.39 MIL/uL (ref 4.22–5.81)
RDW: 13.9 % (ref 11.5–15.5)
WBC: 6.7 10*3/uL (ref 4.0–10.5)

## 2015-02-28 LAB — GLUCOSE, CAPILLARY
GLUCOSE-CAPILLARY: 153 mg/dL — AB (ref 65–99)
GLUCOSE-CAPILLARY: 125 mg/dL — AB (ref 65–99)
GLUCOSE-CAPILLARY: 158 mg/dL — AB (ref 65–99)

## 2015-02-28 LAB — MAGNESIUM: Magnesium: 1.4 mg/dL — ABNORMAL LOW (ref 1.7–2.4)

## 2015-02-28 LAB — HIV ANTIBODY (ROUTINE TESTING W REFLEX): HIV Screen 4th Generation wRfx: NONREACTIVE

## 2015-02-28 SURGERY — RADIOLOGY WITH ANESTHESIA
Anesthesia: General

## 2015-02-28 MED ORDER — IBUPROFEN 200 MG PO TABS: 400.0000 mg | ORAL_TABLET | Freq: Four times a day (QID) | ORAL | Status: AC | PRN

## 2015-02-28 MED ORDER — LANTUS SOLOSTAR 100 UNIT/ML ~~LOC~~ SOPN: 55.0000 [IU] | PEN_INJECTOR | Freq: Every day | SUBCUTANEOUS | Status: AC

## 2015-02-28 MED ORDER — VITAMIN B-12 1000 MCG PO TABS: 1000.0000 ug | ORAL_TABLET | Freq: Every day | ORAL | Status: AC

## 2015-02-28 NOTE — Anesthesia Preprocedure Evaluation (Deleted)
Anesthesia Evaluation  Patient identified by MRN, date of birth, ID band Patient awake    Reviewed: Allergy & Precautions, NPO status , Patient's Chart, lab work & pertinent test results  Airway Mallampati: II  TM Distance: >3 FB Neck ROM: Full    Dental no notable dental hx.    Pulmonary Current Smoker,  breath sounds clear to auscultation  Pulmonary exam normal       Cardiovascular negative cardio ROS Normal cardiovascular examRhythm:Regular Rate:Normal     Neuro/Psych Seizures -,  negative psych ROS   GI/Hepatic negative GI ROS, Neg liver ROS,   Endo/Other  diabetes  Renal/GU negative Renal ROS  negative genitourinary   Musculoskeletal negative musculoskeletal ROS (+)   Abdominal   Peds negative pediatric ROS (+)  Hematology negative hematology ROS (+)   Anesthesia Other Findings   Reproductive/Obstetrics negative OB ROS                             Anesthesia Physical Anesthesia Plan  ASA: III  Anesthesia Plan: General   Post-op Pain Management:    Induction: Intravenous  Airway Management Planned: Oral ETT and LMA  Additional Equipment:   Intra-op Plan:   Post-operative Plan: Extubation in OR  Informed Consent: I have reviewed the patients History and Physical, chart, labs and discussed the procedure including the risks, benefits and alternatives for the proposed anesthesia with the patient or authorized representative who has indicated his/her understanding and acceptance.   Dental advisory given  Plan Discussed with: CRNA and Surgeon  Anesthesia Plan Comments:         Anesthesia Quick Evaluation

## 2015-02-28 NOTE — Progress Notes (Addendum)
2nd note RE: MRI today... Order read to d/c if Pt mental status improved;  Officially canceled order this am based on NS Kirby V.O.  Will continue to monitor Pt.

## 2015-02-28 NOTE — Discharge Instructions (Signed)
Check your blood sugar levels at least 2 times a day (morning and evening) and at any time that you feel your sugars may be high or low.  Call your MD immediately if your sugars are <70 or >400 for further instructions.  Make sure you follow a strict diabetic diet.    Diabetes Mellitus and Food It is important for you to manage your blood sugar (glucose) level. Your blood glucose level can be greatly affected by what you eat. Eating healthier foods in the appropriate amounts throughout the day at about the same time each day will help you control your blood glucose level. It can also help slow or prevent worsening of your diabetes mellitus. Healthy eating may even help you improve the level of your blood pressure and reach or maintain a healthy weight.  HOW CAN FOOD AFFECT ME? Carbohydrates Carbohydrates affect your blood glucose level more than any other type of food. Your dietitian will help you determine how many carbohydrates to eat at each meal and teach you how to count carbohydrates. Counting carbohydrates is important to keep your blood glucose at a healthy level, especially if you are using insulin or taking certain medicines for diabetes mellitus. Alcohol Alcohol can cause sudden decreases in blood glucose (hypoglycemia), especially if you use insulin or take certain medicines for diabetes mellitus. Hypoglycemia can be a life-threatening condition. Symptoms of hypoglycemia (sleepiness, dizziness, and disorientation) are similar to symptoms of having too much alcohol.  If your health care provider has given you approval to drink alcohol, do so in moderation and use the following guidelines:  Women should not have more than one drink per day, and men should not have more than two drinks per day. One drink is equal to:  12 oz of beer.  5 oz of wine.  1 oz of hard liquor.  Do not drink on an empty stomach.  Keep yourself hydrated. Have water, diet soda, or unsweetened iced tea.  Regular  soda, juice, and other mixers might contain a lot of carbohydrates and should be counted. WHAT FOODS ARE NOT RECOMMENDED? As you make food choices, it is important to remember that all foods are not the same. Some foods have fewer nutrients per serving than other foods, even though they might have the same number of calories or carbohydrates. It is difficult to get your body what it needs when you eat foods with fewer nutrients. Examples of foods that you should avoid that are high in calories and carbohydrates but low in nutrients include:  Trans fats (most processed foods list trans fats on the Nutrition Facts label).  Regular soda.  Juice.  Candy.  Sweets, such as cake, pie, doughnuts, and cookies.  Fried foods. WHAT FOODS CAN I EAT? Have nutrient-rich foods, which will nourish your body and keep you healthy. The food you should eat also will depend on several factors, including:  The calories you need.  The medicines you take.  Your weight.  Your blood glucose level.  Your blood pressure level.  Your cholesterol level. You also should eat a variety of foods, including:  Protein, such as meat, poultry, fish, tofu, nuts, and seeds (lean animal proteins are best).  Fruits.  Vegetables.  Dairy products, such as milk, cheese, and yogurt (low fat is best).  Breads, grains, pasta, cereal, rice, and beans.  Fats such as olive oil, trans fat-free margarine, canola oil, avocado, and olives. DOES EVERYONE WITH DIABETES MELLITUS HAVE THE SAME MEAL PLAN? Because every person  with diabetes mellitus is different, there is not one meal plan that works for everyone. It is very important that you meet with a dietitian who will help you create a meal plan that is just right for you. Document Released: 04/29/2005 Document Revised: 08/07/2013 Document Reviewed: 06/29/2013 [facility] Patient Information 2015 [place], [place]. This information is not intended to replace advice given to you by  your health care provider. Make sure you discuss any questions you have with your health care provider.   Altered Mental Status Altered mental status most often refers to an abnormal change in your responsiveness and awareness. It can affect your speech, thought, mobility, memory, attention span, or alertness. It can range from slight confusion to complete unresponsiveness (coma). Altered mental status can be a sign of a serious underlying medical condition. Rapid evaluation and medical treatment is necessary for patients having an altered mental status. CAUSES   Low blood sugar (hypoglycemia) or diabetes.  Severe loss of body fluids (dehydration) or a body salt (electrolyte) imbalance.  A stroke or other neurologic problem, such as dementia or delirium.  A head injury or tumor.  A drug or alcohol overdose.  Exposure to toxins or poisons.  Depression, anxiety, and stress.  A low oxygen level (hypoxia).  An infection.  Blood loss.  Twitching or shaking (seizure).  Heart problems, such as heart attack or heart rhythm problems (arrhythmias).  A body temperature that is too low or too high (hypothermia or hyperthermia). DIAGNOSIS  A diagnosis is based on your history, symptoms, physical and neurologic examinations, and diagnostic tests. Diagnostic tests may include:  Measurement of your blood pressure, pulse, breathing, and oxygen levels (vital signs).  Blood tests.  Urine tests.  X-ray exams.  A computerized magnetic scan (magnetic resonance imaging, MRI).  A computerized X-ray scan (computed tomography, CT scan). TREATMENT  Treatment will depend on the cause. Treatment may include:  Management of an underlying medical or mental health condition.  Critical care or support in the hospital. HOME CARE INSTRUCTIONS   Only take over-the-counter or prescription medicines for pain, discomfort, or fever as directed by your caregiver.  Manage underlying conditions as  directed by your caregiver.  Eat a healthy, well-balanced diet to maintain strength.  Join a support group or prevention program to cope with the condition or trauma that caused the altered mental status. Ask your caregiver to help choose a program that works for you.  Follow up with your caregiver for further examination, therapy, or testing as directed. SEEK MEDICAL CARE IF:   You feel unwell or have chills.  You or your family notice a change in your behavior or your alertness.  You have trouble following your caregiver's treatment plan.  You have questions or concerns. SEEK IMMEDIATE MEDICAL CARE IF:   You have a rapid heartbeat or have chest pain.  You have difficulty breathing.  You have a fever.  You have a headache with a stiff neck.  You cough up blood.  You have blood in your urine or stool.  You have severe agitation or confusion. MAKE SURE YOU:   Understand these instructions.  Will watch your condition.  Will get help right away if you are not doing well or get worse. Document Released: 01/20/2010 Document Revised: 10/25/2011 Document Reviewed: 01/20/2010 [facility] Patient Information 2015 [place], [place]. This information is not intended to replace advice given to you by your health care provider. Make sure you discuss any questions you have with your health care provider.

## 2015-02-28 NOTE — Progress Notes (Signed)
PT DISCHARGE NOTE  Patient Details Name: Charles Blevins MRN: [id number] DOB: 11/08/1955   Cancelled Treatment:    Reason Eval/Treat Not Completed: OT screened, no needs identified, will sign off.  Per OT, pt is Independent in the halls and has no PT needs at this time. 02/28/2015  [place] BingKen Zale Marcotte, PT [phone]  (pager)   Adesuwa Osgood, Eliseo GumKenneth V 02/28/2015, 2:06 PM

## 2015-02-28 NOTE — Discharge Summary (Signed)
DISCHARGE SUMMARY  Charles Blevins  MR#: [id number]  DOB:[birth year]  Date of Admission: 02/25/2015 Date of Discharge: 02/28/2015  Attending Physician:Renesmee Raine T  Patient's WUJ:WJXBJ,YNWGNFAOZ, MD  Consults:  none  Disposition: D/C home   Follow-up Appts:     Follow-up Information    Follow up with [facility], MD. Schedule an appointment as soon as possible for a visit in 5 days.   Specialty:  Family Medicine   Contact information:   [address] [place] [address] [phone]      Tests Needing Follow-up: -f/u of CBG range is suggested -f/u of B12 level in 6-8 weeks is suggested   Discharge Diagnoses: Metabolic encephalopathy Uncontrolled DM type 2 / DKA ? Seizure disorder Fibromyalgia / chronic back pain Dehydration, moderate Mild hypokalemia  Initial presentation: 59 year old patient with known history diabetes, questionable history of seizure disorder, as well as chronic back pain/fibromyalgia who was sent to the ER via EMS because of altered mentation. The patient's wife had noted he was confused around 5 AM / alert to person only. He remained profoundly confused so his wife had him brought to the hospital.  In the ER patient was afebrile. Chest x-ray revealed no acute pulmonary process. Serum glucose was 405 with a CO2 of 19 and an anion gap of 18 therefore the patient was started on IV fluids and an insulin infusion for DKA. Troponin was negative, lactic acid was 1.85. CBC was negative. Urine drug screen was positive for opiates noting he has been prescribed opiates.  Hospital Course:  Metabolic encephalopathy Etiology unclear - DKA resolved rapidly with no immediate improvement in mental status suggesting that this was not the cause - CT brain negative - EEG w/o evidence of seizure activity but did show generalized slowing - ammonia normal - B12 only very mildly decreased at 362 - folic acid, ammonia, TSH, and RPR all normal/negative -  lyrica stopped (was at max dose at time of admit) - the patient's mental status has returned to normal and he is up ambulating about the unit with no difficulty whatsoever - we will not resume Lyrica at time of discharge - patient is advised that his symptoms could return since we have not received a formal diagnosis regarding the etiology - presently however he is entirely stable and there is little or no benefit to be gained by prolonging his hospital stay  Uncontrolled DM type 2 / DKA CBG well controlled at the time of discharge - bicarb normalized and anion gap normal - was weaned from insulin gtt -  A1c 13.6 suggesting noncompliance with diet or insulin regimen or both as an outpatient given that his CBGs are currently well-controlled without use of Lantus - will instruct patient to follow CBGs closely at home and plan to resume Lantus tomorrow (7/16)  ? Seizure disorder Reported in history but patient not on chronic meds - EEG w/o epileptiform activity  Fibromyalgia / chronic back pain -Patient on max dose Lyrica prior to admission at 300 mg twice daily - held all potentially sedative medications including Lyrica and narcotics  Dehydration, moderate Appears volume resuscitated at this time  Mild hypokalemia Due to tx of DKA / IV insulin - replace and follow     Medication List    STOP taking these medications        pregabalin 300 MG capsule  Commonly known as:  LYRICA      TAKE these medications        ibuprofen 200 MG  tablet  Commonly known as:  ADVIL,MOTRIN  Take 2 tablets (400 mg total) by mouth every 6 (six) hours as needed for moderate pain.     LANTUS SOLOSTAR 100 UNIT/ML Solostar Pen  Generic drug:  Insulin Glargine  Inject 55 Units into the skin daily.  Start taking on:  03/01/2015     metFORMIN 1000 MG tablet  Commonly known as:  GLUCOPHAGE  Take 500 mg by mouth 2 (two) times daily with a meal.     oxymetazoline 0.05 % nasal spray  Commonly known as:  AFRIN    Place 1 spray into both nostrils every morning.     vitamin B-12 1000 MCG tablet  Commonly known as:  CYANOCOBALAMIN  Take 1 tablet (1,000 mcg total) by mouth daily.       Day of Discharge BP 116/62 mmHg  Pulse 74  Temp(Src) 98.3 F (36.8 C) (Oral)  Resp 18  Ht 5\' 7"  (1.702 m)  Wt 96.6 kg (212 lb 15.4 oz)  BMI 33.35 kg/m2  SpO2 96%  Physical Exam: General: No acute respiratory distress Lungs: Clear to auscultation bilaterally without wheezes or crackles Cardiovascular: Regular rate and rhythm without murmur gallop or rub normal S1 and S2 Abdomen: Nontender, nondistended, soft, bowel sounds positive, no rebound, no ascites, no appreciable mass Extremities: No significant cyanosis, clubbing, or edema bilateral lower extremities  Basic Metabolic Panel:  Recent Labs Lab 02/25/15 1858 02/25/15 2204 02/26/15 0131 02/27/15 0249 02/28/15 0905  NA 144 144 145 142 139  K 3.8 3.8 3.4* 3.0* 3.4*  CL 111 110 111 111 107  CO2 26 23 28 23 23   GLUCOSE 184* 175* 103* 73 143*  BUN 19 20 19 16 11   CREATININE 0.71 0.71 0.63 0.67 0.73  CALCIUM 8.8* 8.9 8.8* 8.4* 8.4*  MG 1.8  --   --   --  1.4*  PHOS 1.2*  --   --   --   --     Liver Function Tests:  Recent Labs Lab 02/25/15 1031 02/27/15 0249 02/28/15 0905  AST 13* 16 14*  ALT 15* 13* 12*  ALKPHOS 78 60 59  BILITOT 1.1 1.0 1.4*  PROT 7.3 5.9* 6.0*  ALBUMIN 3.7 2.9* 3.0*    Recent Labs Lab 02/25/15 1530  AMMONIA 16   Coags:  Recent Labs Lab 02/25/15 1031  INR 0.97   CBC:  Recent Labs Lab 02/25/15 1031 02/27/15 0249 02/28/15 0905  WBC 7.9 7.3 6.7  NEUTROABS 6.5  --  4.5  HGB 16.8 14.7 15.6  HCT 49.8 44.2 46.3  MCV 87.1 86.8 85.9  PLT 147* 149* 158    Cardiac Enzymes:  Recent Labs Lab 02/25/15 1530  CKTOTAL 38*    CBG:  Recent Labs Lab 02/27/15 1205 02/27/15 1704 02/27/15 2104 02/28/15 0810 02/28/15 1156  GLUCAP 107* 156* 153* 125* 158*    Recent Results (from the past 240  hour(s))  Urine culture     Status: None   Collection Time: 02/25/15 11:08 AM  Result Value Ref Range Status   Specimen Description URINE, RANDOM  Final   Special Requests NONE  Final   Culture NO GROWTH 1 DAY  Final   Report Status 02/26/2015 FINAL  Final  MRSA PCR Screening     Status: None   Collection Time: 02/25/15  3:20 PM  Result Value Ref Range Status   MRSA by PCR NEGATIVE NEGATIVE Final    Comment:        The GeneXpert MRSA  Assay (FDA approved for NASAL specimens only), is one component of a comprehensive MRSA colonization surveillance program. It is not intended to diagnose MRSA infection nor to guide or monitor treatment for MRSA infections.      Time spent in discharge (includes decision making & examination of pt): >35 minutes  02/28/2015, 4:18 PM   Lonia Blood, MD [facility] Office  [phone] Pager [phone]  On-Call/Text Page:      [email or web address]      password [facility]

## 2015-02-28 NOTE — Progress Notes (Signed)
Pt has a order for a MRI with sedation. Pt is alert and oriented x 4. MRI orders clarified with NP Schorr. Per NP pt. Does not need MRI with sedation at this time. Will continue to monitor pt.

## 2015-02-28 NOTE — Progress Notes (Signed)
Pt d/c this pm, VSS, amb unit several laps. Reviewed d/c inst, inc f/up visit, and DM education. Patient & family indicated understanding

## 2015-02-28 NOTE — Evaluation (Signed)
  Occupational Therapy Evaluation and Discharge Patient Details Name: Charles Blevins MRN: [id number] DOB: 12/20/1955 Today's Date: 02/28/2015    History of Present Illness This is a 59 year old patient with known history diabetes, questionable history of seizure disorder, as well as chronic back pain/fibromyalgia who was sent to the ER through EMS because of altered mentation. CT negative, blood glucose 405   Clinical Impression   This 59 yo male admitted with above presents to acute OT at a Mod I level for BADLs and mobility--made PT aware that I did not identify any PT needs. Acute OT will sign off.    Follow Up Recommendations  No OT follow up    Equipment Recommendations  None recommended by OT       Precautions / Restrictions Precautions Precautions: None Restrictions Weight Bearing Restrictions: No      Mobility Bed Mobility Overal bed mobility: Independent                Transfers Overall transfer level: Independent                         ADL Overall ADL's : Independent                                             Vision Additional Comments: No change from baseline          Pertinent Vitals/Pain Pain Assessment: No/denies pain     Hand Dominance Right   Extremity/Trunk Assessment Upper Extremity Assessment Upper Extremity Assessment: Overall WFL for tasks assessed   Lower Extremity Assessment Lower Extremity Assessment: Overall WFL for tasks assessed       Communication Communication Communication: No difficulties   Cognition Arousal/Alertness: Awake/alert Behavior During Therapy: WFL for tasks assessed/performed Overall Cognitive Status: Within Functional Limits for tasks assessed                                Home Living Family/patient expects to be discharged to:: Private residence Living Arrangements: Spouse/significant other;Children Available Help at Discharge: Family;Available 24  hours/day Type of Home: House Home Access: Stairs to enter Entergy CorporationEntrance Stairs-Number of Steps: 2 Entrance Stairs-Rails: Right Home Layout: One level     Bathroom Shower/Tub: Runner, broadcasting/film/videoWalk-in shower         Home Equipment: None          Prior Functioning/Environment Level of Independence: Independent        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, advertisingcarpenter    OT Diagnosis: Altered mental status (now cleared)         OT Goals(Current goals can be found in the care plan section) Acute Rehab OT Goals Patient Stated Goal: to go home  OT Frequency:                End of Session Equipment Utilized During Treatment:  (none) Nurse Communication:  (nurse and NT saw pt up in hallway ambulating without issues and no AD)  Activity Tolerance: Patient tolerated treatment well Patient left:  (getting himself to the bathroom)   Time: 1331-1351 OT Time Calculation (min): 20 min Charges:  OT General Charges $OT Visit: 1 Procedure OT Evaluation $Initial OT Evaluation Tier I: 1 Procedure  Charles GeorgesLeonard, Charles Blevins [phone] 02/28/2015, 1:57 PM

## 2015-03-02 ENCOUNTER — Encounter (HOSPITAL_COMMUNITY): Payer: Self-pay

## 2015-03-02 ENCOUNTER — Emergency Department (HOSPITAL_COMMUNITY)
Admission: EM | Admit: 2015-03-02 | Discharge: 2015-03-02 | Disposition: A | Discharge status: Home / Self Care | Payer: 59 | Attending: Emergency Medicine | Admitting: Emergency Medicine

## 2015-03-02 DIAGNOSIS — Y998 Other external cause status: Secondary | ICD-10-CM | POA: Diagnosis not present

## 2015-03-02 DIAGNOSIS — G8929 Other chronic pain: Secondary | ICD-10-CM | POA: Insufficient documentation

## 2015-03-02 DIAGNOSIS — W010XXA Fall on same level from slipping, tripping and stumbling without subsequent striking against object, initial encounter: Secondary | ICD-10-CM | POA: Insufficient documentation

## 2015-03-02 DIAGNOSIS — Z79899 Other long term (current) drug therapy: Secondary | ICD-10-CM | POA: Insufficient documentation

## 2015-03-02 DIAGNOSIS — Y9389 Activity, other specified: Secondary | ICD-10-CM | POA: Insufficient documentation

## 2015-03-02 DIAGNOSIS — Z720 Tobacco use: Secondary | ICD-10-CM | POA: Diagnosis not present

## 2015-03-02 DIAGNOSIS — E119 Type 2 diabetes mellitus without complications: Secondary | ICD-10-CM | POA: Diagnosis not present

## 2015-03-02 DIAGNOSIS — M170 Bilateral primary osteoarthritis of knee: Secondary | ICD-10-CM | POA: Insufficient documentation

## 2015-03-02 DIAGNOSIS — Y9289 Other specified places as the place of occurrence of the external cause: Secondary | ICD-10-CM | POA: Diagnosis not present

## 2015-03-02 DIAGNOSIS — Z043 Encounter for examination and observation following other accident: Secondary | ICD-10-CM | POA: Diagnosis not present

## 2015-03-02 DIAGNOSIS — W19XXXA Unspecified fall, initial encounter: Secondary | ICD-10-CM

## 2015-03-02 DIAGNOSIS — Z794 Long term (current) use of insulin: Secondary | ICD-10-CM | POA: Diagnosis not present

## 2015-03-02 LAB — CBC WITH DIFFERENTIAL/PLATELET
Basophils Absolute: 0 10*3/uL (ref 0.0–0.1)
Basophils Relative: 1 % (ref 0–1)
Eosinophils Absolute: 0.2 10*3/uL (ref 0.0–0.7)
Eosinophils Relative: 3 % (ref 0–5)
HCT: 44.2 % (ref 39.0–52.0)
HEMOGLOBIN: 15.1 g/dL (ref 13.0–17.0)
LYMPHS ABS: 1.8 10*3/uL (ref 0.7–4.0)
LYMPHS PCT: 29 % (ref 12–46)
MCH: 28.8 pg (ref 26.0–34.0)
MCHC: 34.2 g/dL (ref 30.0–36.0)
MCV: 84.4 fL (ref 78.0–100.0)
Monocytes Absolute: 0.4 10*3/uL (ref 0.1–1.0)
Monocytes Relative: 6 % (ref 3–12)
NEUTROS PCT: 61 % (ref 43–77)
Neutro Abs: 4 10*3/uL (ref 1.7–7.7)
PLATELETS: 166 10*3/uL (ref 150–400)
RBC: 5.24 MIL/uL (ref 4.22–5.81)
RDW: 14 % (ref 11.5–15.5)
WBC: 6.4 10*3/uL (ref 4.0–10.5)

## 2015-03-02 LAB — RAPID URINE DRUG SCREEN, HOSP PERFORMED
Amphetamines: NOT DETECTED
BARBITURATES: NOT DETECTED
BENZODIAZEPINES: NOT DETECTED
Cocaine: NOT DETECTED
Opiates: NOT DETECTED
Tetrahydrocannabinol: NOT DETECTED

## 2015-03-02 LAB — URINALYSIS, ROUTINE W REFLEX MICROSCOPIC
Bilirubin Urine: NEGATIVE
Glucose, UA: >1000 mg/dL — AB
Hgb urine dipstick: NEGATIVE
Ketones, ur: NEGATIVE mg/dL
LEUKOCYTES UA: NEGATIVE
NITRITE: NEGATIVE
PH: 6 (ref 5.0–8.0)
Protein, ur: NEGATIVE mg/dL
SPECIFIC GRAVITY, URINE: 1.037 — AB (ref 1.005–1.030)
UROBILINOGEN UA: 0.2 mg/dL (ref 0.0–1.0)

## 2015-03-02 LAB — COMPREHENSIVE METABOLIC PANEL
ALK PHOS: 59 U/L (ref 38–126)
ALT: 15 U/L — ABNORMAL LOW (ref 17–63)
ANION GAP: 9 (ref 5–15)
AST: 16 U/L (ref 15–41)
Albumin: 3.2 g/dL — ABNORMAL LOW (ref 3.5–5.0)
BUN: 10 mg/dL (ref 6–20)
CHLORIDE: 101 mmol/L (ref 101–111)
CO2: 24 mmol/L (ref 22–32)
Calcium: 9 mg/dL (ref 8.9–10.3)
Creatinine, Ser: 0.62 mg/dL (ref 0.61–1.24)
GFR calc non Af Amer: >60 mL/min (ref >60)
Glucose, Bld: 397 mg/dL — ABNORMAL HIGH (ref 65–99)
POTASSIUM: 3.9 mmol/L (ref 3.5–5.1)
SODIUM: 134 mmol/L — AB (ref 135–145)
TOTAL PROTEIN: 6.1 g/dL — AB (ref 6.5–8.1)
Total Bilirubin: 0.4 mg/dL (ref 0.3–1.2)

## 2015-03-02 LAB — URINE MICROSCOPIC-ADD ON: WBC, UA: NONE SEEN WBC/hpf (ref <3)

## 2015-03-02 LAB — AMMONIA: AMMONIA: 41 umol/L — AB (ref 9–35)

## 2015-03-02 LAB — CBG MONITORING, ED: Glucose-Capillary: 320 mg/dL — ABNORMAL HIGH (ref 65–99)

## 2015-03-02 NOTE — Care Management Note (Addendum)
Case Management Note  Patient Details  Name: Charles Blevins MRN: [id number] Date of Birth: 04/24/1956  Subjective/Objective:      CM consulted as pt has One Touch Ultra Glucometer which is broken. Needs replacement. Has multiple strips.             Action/Plan: [facility] pt aware [facility]  Gave pt a breakdown of cost comparison between Relion and One Touch. Meters are comparable but strips  Vary by $60.00 with One Touch being the most expensive. Pt appreciative of info.    Expected Discharge Date:                  Expected Discharge Plan:     In-House Referral:     Discharge planning Services     Post Acute Care Choice:    Choice offered to:     DME Arranged:    DME Agency:     HH Arranged:    HH Agency:     Status of Service:     Medicare Important Message Given:    Date Medicare IM Given:    Medicare IM give by:    Date Additional Medicare IM Given:    Additional Medicare Important Message give by:     If discussed at Long Length of Stay Meetings, dates discussed:    Additional Comments:  Yvone NeuCrutchfield, Lateshia Schmoker M, RN 03/02/2015, 1:42 PM

## 2015-03-02 NOTE — ED Notes (Signed)
Pt d/c last week for DKA, altered mental status that resolved. Pt did not get MRI.  Pt returns today because wife is concerned because he tripped over rug last night and fell down on knees.  NO head injury or LOC.

## 2015-03-02 NOTE — Discharge Instructions (Signed)

## 2015-03-02 NOTE — ED Provider Notes (Signed)
CSN: [id number]     Arrival date & time 03/02/15  1204 History   First MD Initiated Contact with Patient 03/02/15 1249     Chief Complaint  Patient presents with  . Fall     (Consider location/radiation/quality/duration/timing/severity/associated sxs/prior Treatment) HPI Patient is a 59 year old male past medical history of diabetes, fibromyalgia, back pain who presents the ER brought by his family. Patient's family reports that patient fell last night, and they're concerned because he left the stove on in their house while cooking in the middle the night. Patient was recently seen and admitted last week, discharged 2 days ago for a metabolic encephalopathy in the setting of DKA. Family is concerned that patient may have been confused last night as he had an acute onset of confusion last night which led to an ER visit and consecutive hospitalization. They're concerned today that he may have some residual effects from his encephalopathy. Patient denies having any complaints, is completely alert and oriented and coherent. Patient denies having any pain status post fall. He denies having any injury. Patient states he does not believe he is confused, states he simply forgot to turn the stove off. Patient states he has been having difficulty sleeping since his Lyrica has recently been discontinued, and this is why he was up cooking grill cheese in the middle the night.  Past Medical History  Diagnosis Date  . Back pain   . Diabetes mellitus without complication   . Fibromyalgia   . Seizures   . Arthritis, bil knees with chronic pain 11/07/2013   Past Surgical History  Procedure Laterality Date  . Knee surgery     Family History  Problem Relation Age of Onset  . Diabetes Mother   . Leukemia Father [id number]  . Healthy Sister   . Healthy Brother   . Healthy Sister   . Healthy Brother   . Healthy Brother   . Healthy Brother    History  Substance Use Topics  . Smoking status: Current Every Day  Smoker -- 0.50 packs/day    Types: Cigarettes  . Smokeless tobacco: Never Used  . Alcohol Use: No    Review of Systems  Constitutional: Negative for fever.  HENT: Negative for trouble swallowing.   Eyes: Negative for visual disturbance.  Respiratory: Negative for shortness of breath.   Cardiovascular: Negative for chest pain.  Gastrointestinal: Negative for nausea, vomiting and abdominal pain.  Genitourinary: Negative for dysuria.  Musculoskeletal: Negative for neck pain.  Skin: Negative for rash.  Neurological: Negative for dizziness, weakness and numbness.  Psychiatric/Behavioral: Negative.       Allergies  Review of patient's allergies indicates no known allergies.  Home Medications   Prior to Admission medications   Medication Sig Start Date End Date Taking? Authorizing Provider  ibuprofen (ADVIL,MOTRIN) 200 MG tablet Take 2 tablets (400 mg total) by mouth every 6 (six) hours as needed for moderate pain. 02/28/15  Yes Lonia BloodJeffrey T McClung, MD  LANTUS SOLOSTAR 100 UNIT/ML Solostar Pen Inject 55 Units into the skin daily. 03/01/15  Yes Lonia BloodJeffrey T McClung, MD  metFORMIN (GLUCOPHAGE) 1000 MG tablet Take 500 mg by mouth 2 (two) times daily with a meal.    Yes Historical Provider, MD  oxymetazoline (AFRIN) 0.05 % nasal spray Place 1 spray into both nostrils every morning.   Yes Historical Provider, MD  vitamin B-12 (CYANOCOBALAMIN) 1000 MCG tablet Take 1 tablet (1,000 mcg total) by mouth daily. 02/28/15  Yes Lonia BloodJeffrey T McClung, MD   BP  127/75 mmHg  Pulse 65  Temp(Src) 98.3 F (36.8 C) (Oral)  Resp 18  SpO2 97% Physical Exam  Constitutional: He is oriented to person, place, and time. He appears well-developed and well-nourished. No distress.  HENT:  Head: Normocephalic and atraumatic.  Mouth/Throat: Oropharynx is clear and moist. No oropharyngeal exudate.  Eyes: Right eye exhibits no discharge. Left eye exhibits no discharge. No scleral icterus.  Neck: Normal range of motion.   Cardiovascular: Normal rate, regular rhythm and normal heart sounds.   No murmur heard. Pulmonary/Chest: Effort normal and breath sounds normal. No respiratory distress.  Abdominal: Soft. There is no tenderness.  Musculoskeletal: Normal range of motion. He exhibits no edema or tenderness.  Neurological: He is alert and oriented to person, place, and time. No cranial nerve deficit. Coordination normal.  Skin: Skin is warm and dry. No rash noted. He is not diaphoretic.  Psychiatric: He has a normal mood and affect.  Nursing note and vitals reviewed.   ED Course  Procedures (including critical care time) Labs Review Labs Reviewed  COMPREHENSIVE METABOLIC PANEL - Abnormal; Notable for the following:    Sodium 134 (*)    Glucose, Bld 397 (*)    Total Protein 6.1 (*)    Albumin 3.2 (*)    ALT 15 (*)    All other components within normal limits  URINALYSIS, ROUTINE W REFLEX MICROSCOPIC (NOT AT [facility]) - Abnormal; Notable for the following:    Specific Gravity, Urine 1.037 (*)    Glucose, UA >1000 (*)    All other components within normal limits  AMMONIA - Abnormal; Notable for the following:    Ammonia 41 (*)    All other components within normal limits  CBG MONITORING, ED - Abnormal; Notable for the following:    Glucose-Capillary 320 (*)    All other components within normal limits  CBC WITH DIFFERENTIAL/PLATELET  URINE RAPID DRUG SCREEN, HOSP PERFORMED  URINE MICROSCOPIC-ADD ON    Imaging Review No results found.   EKG Interpretation None      MDM   Final diagnoses:  Fall, initial encounter    I personally saw this patient in evaluation in the emergency room last week when he had an altered mental status secondary to metabolic encephalopathy. Today patient does not have any signs or symptoms consistent with the way he was acting previously last week. Patient is well-appearing, afebrile, hemodynamically stable, neurologically intact, speaking in full, clear sentences and  coherent. There is concern by family of some mild short-term memory loss. Patient is stating to me that he was up in the middle the night because he is having trouble sleeping since being off of his Lyrica. He states he recalls tripping over the rug and falling. He states this was a completely mechanical fall. He denies having any injury from this fall. Patient denies having any complaints during his stay in the ER. Screening labs were performed, did not show any evidence of acute pathology. Neurologic exam remains benign, do not believe further neuro workup is needed emergently. Patient is stable for discharge, we'll discharge patient in the ED, strongly encouraged follow-up with PCP and neurology. Return precautions to the emergency department discussed with patient and his family member, they verbalize understanding and agreement of this plan.  BP 127/75 mmHg  Pulse 65  Temp(Src) 98.3 F (36.8 C) (Oral)  Resp 18  SpO2 97%  Signed,  Ladona Mow, PA-C 5:20 PM  Patient seen and discussed with Dr. Rolland Porter, MD  Ladona Mow, PA-C 03/02/15 1720  Rolland Porter, MD 03/03/15 [phone]

## 2015-03-05 ENCOUNTER — Encounter: Payer: Self-pay | Admitting: Neurology

## 2015-03-05 ENCOUNTER — Ambulatory Visit (INDEPENDENT_AMBULATORY_CARE_PROVIDER_SITE_OTHER): Payer: 59 | Admitting: Neurology

## 2015-03-05 VITALS — BP 125/78 | HR 81 | Temp 98.4°F | Ht 67.0 in | Wt 214.5 lb

## 2015-03-05 DIAGNOSIS — E1310 Other specified diabetes mellitus with ketoacidosis without coma: Secondary | ICD-10-CM

## 2015-03-05 DIAGNOSIS — R404 Transient alteration of awareness: Secondary | ICD-10-CM

## 2015-03-05 DIAGNOSIS — E1110 Type 2 diabetes mellitus with ketoacidosis without coma: Secondary | ICD-10-CM

## 2015-03-05 MED ORDER — PREGABALIN 300 MG PO CAPS: 300.0000 mg | ORAL_CAPSULE | Freq: Two times a day (BID) | ORAL | Status: DC

## 2015-03-05 NOTE — Patient Instructions (Signed)
Overall you are doing fairly well but I do want to suggest a few things today:   Remember to drink plenty of fluid, eat healthy meals and do not skip any meals. Try to eat protein with a every meal and eat a healthy snack such as fruit or nuts in between meals. Try to keep a regular sleep-wake schedule and try to exercise daily, particularly in the form of walking, 20-30 minutes a day, if you can.   As far as diagnostic testing: MRI of the brain, EEG  I would like to see you back in 6 months, sooner if we need to. Please call us with any interim questions, concerns, problems, updates or refill requests.   Please also call us for any test results so we can go over those with you on the phone.  My clinical assistant and will answer any of your questions and relay your messages to me and also relay most of my messages to you.   Our phone number is [phone]. We also have an after hours call service for urgent matters and there is a physician on-call for urgent questions. For any emergencies you know to call 911 or go to the nearest emergency room

## 2015-03-05 NOTE — Progress Notes (Addendum)
[id number]    Provider:  Dr Jaynee Eagles Referring Provider: [name], MD Primary Care Physician:  Rachell Cipro, MD  CC:  Alteration of consciousness  Addendum: Patient is calling the on-call doctor about headaches. I have already explained to him that I cannot treat him for something I have not evaluated. When patient came into the office for bloodwork he saw me in the hallway and tried discussing a new complaint of headaches.I have no history of his headaches and he didn't discuss it with me at his one appointment with [place].  I explained to him he should see his primary care physician or follow up with me in the office. If his headaches are severe he needs to proceed to the ED. But we cannot take a history of headaches over the phone or in the hallway and we cannot prescribe medication for something we have not evaluated.    HPI:  Charles Blevins is a 59 y.o. male here as a referral from Dr. Ernie Hew for memory loss. Past medical history of back pain, diabetes mellitus, fibromyalgia, alterations of consciousness/questionable history of seizure disorder.   He was in the ED and was confused. His blood glucose was 600. Son thought he went crazy. He has problems maintaining glucose levels. And sometimes levels get too low and gets shakiy. Son provides most information. He was doing things that made no sense. He was doing things but had no idea what was going on, non-purposeful tasks. He was in the ED on July 13th. He was oriented only to person. This has never happened. In the past when he was shaky, never had LOC or altered mentation with the shakes. He says he has a very bad case of Fibromyalgia. No history of alcohol use. He is having memory problems but it has been doing well for the last few. Sometimes his memory is worse than others. More short-term memory difficulty but not getting worse, stable. Son denies history of memory changes, or forgetfullness. No fhx of sizures.    Reviewed notes, labs and imaging from outside physicians, which showed: He presented on July 12 to the emergency room is of altered mentation. Patient's wife noted he was confused about 5 AM was alert to person only. Serum glucose was 405 with a CO2 of 19 and an anion gap of 18.lactic acid 1.85. Urine drug screen +opiates (not prescribed recently).  EEG was abnormal secondary to generalized slowing. No epileptiform activity was noted.  CT head showed No acute intracranial abnormalities including mass lesion or mass effect, hydrocephalus, extra-axial fluid collection, midline shift, hemorrhage, or acute infarction, large ischemic events (personally reviewed images)   Review of Systems: Patient complains of symptoms per HPI as well as the following symptoms: numbness. Pertinent negatives per HPI. All others negative.   History   Social History  . Marital Status: Married    Spouse Name: N/A  . Number of Children: 4  . Years of Education: 14   Occupational History  . Not on file.   Social History Main Topics  . Smoking status: Current Every Day Smoker -- 1.00 packs/day    Types: Cigarettes  . Smokeless tobacco: Never Used  . Alcohol Use: No  . Drug Use: No  . Sexual Activity: Not on file   Other Topics Concern  . Not on file   Social History Narrative   Lives at home with son and wife.    Caffeine use: 2 cups coffee/day       Family  History  Problem Relation Age of Onset  . Diabetes Mother   . Leukemia Father 17  . Healthy Sister   . Healthy Brother   . Healthy Sister   . Healthy Brother   . Healthy Brother   . Healthy Brother     Past Medical History  Diagnosis Date  . Back pain   . Diabetes mellitus without complication   . Fibromyalgia   . Seizures   . Arthritis, bil knees with chronic pain 11/07/2013    Past Surgical History  Procedure Laterality Date  . Knee surgery      Current Outpatient Prescriptions  Medication Sig Dispense Refill  .  ibuprofen (ADVIL,MOTRIN) 200 MG tablet Take 2 tablets (400 mg total) by mouth every 6 (six) hours as needed for moderate pain. 30 tablet 0  . LANTUS SOLOSTAR 100 UNIT/ML Solostar Pen Inject 55 Units into the skin daily. 15 mL 3  . metFORMIN (GLUCOPHAGE) 1000 MG tablet Take 500 mg by mouth 2 (two) times daily with a meal.     . oxymetazoline (AFRIN) 0.05 % nasal spray Place 1 spray into both nostrils every morning.    . Blood Glucose Monitoring Suppl (ONETOUCH VERIO) W/DEVICE KIT     . vitamin B-12 (CYANOCOBALAMIN) 1000 MCG tablet Take 1 tablet (1,000 mcg total) by mouth daily. (Patient not taking: Reported on 03/05/2015)     No current facility-administered medications for this visit.    Allergies as of 03/05/2015  . (No Known Allergies)    Vitals: BP 125/78 mmHg  Pulse 81  Temp(Src) 98.4 F (36.9 C) (Oral)  Ht 5' 7"  (1.702 m)  Wt 214 lb 8 oz (97.297 kg)  BMI 33.59 kg/m2 Last Weight:  Wt Readings from Last 1 Encounters:  03/05/15 214 lb 8 oz (97.297 kg)   Last Height:   Ht Readings from Last 1 Encounters:  03/05/15 5' 7"  (1.702 m)   Physical exam: Exam: Gen: NAD, conversant, obese                CV: RRR, no MRG. No Carotid Bruits. No peripheral edema, warm, nontender Eyes: Conjunctivae clear without exudates or hemorrhage  Neuro: Detailed Neurologic Exam  Speech:    Speech is normal; fluent and spontaneous with normal comprehension.  Cognition:    The patient is oriented to person, place, and time;     recent and remote memory intact;     language fluent;     normal attention, concentration,     fund of knowledge Cranial Nerves:    The pupils are equal, round, and reactive to light. The fundi are flat. Visual fields are full to finger confrontation. Extraocular movements are intact. Trigeminal sensation is intact and the muscles of mastication are normal. The face is symmetric. The palate elevates in the midline. Hearing intact. Voice is normal. Shoulder shrug is  normal. The tongue has normal motion without fasciculations.   Coordination:    Normal finger to nose and heel to shin. Normal rapid alternating movements.   Gait:    Antalgic   Motor Observation:    No asymmetry, no atrophy, and no involuntary movements noted. Tone:    Normal muscle tone.    Posture:    Posture is normal.     Strength:    Strength is V/V in the upper and lower limbs.      Sensation: intact to LT     Reflex Exam:  DTR's: Absent AJs.   Toes:    The  toes are equivocal bilaterally.   Clonus:    Clonus is absent.       Assessment/Plan:  59 year old male with complicated past medical history who presented to the emergency room in July with altered mentation. Likely metabolic encephalopathy due to uncontrolled diabetes, his glucose was over 400 with an anion gap. Hemoglobin A1c 10.9. Will work up for seizures. Discussed medication compliance. No AEDs at this time. Will refill his Lyrica for his neuropathy and fibromyalgia.  Repeat EEG MRI of the brain w/wo contrast  Sarina Ill, MD  [facility] [address] [place], [place] [address]  Phone [phone] Fax [phone]

## 2015-03-09 ENCOUNTER — Encounter: Payer: Self-pay | Admitting: Neurology

## 2015-03-10 ENCOUNTER — Ambulatory Visit (INDEPENDENT_AMBULATORY_CARE_PROVIDER_SITE_OTHER): Payer: 59 | Admitting: Neurology

## 2015-03-10 ENCOUNTER — Telehealth: Payer: Self-pay | Admitting: Neurology

## 2015-03-10 ENCOUNTER — Other Ambulatory Visit: Payer: Self-pay | Admitting: *Deleted

## 2015-03-10 DIAGNOSIS — R519 Headache, unspecified: Secondary | ICD-10-CM

## 2015-03-10 DIAGNOSIS — R51 Headache: Principal | ICD-10-CM

## 2015-03-10 DIAGNOSIS — R404 Transient alteration of awareness: Secondary | ICD-10-CM | POA: Diagnosis not present

## 2015-03-10 NOTE — Procedures (Signed)
    History:  Charles Blevins is a 59 year old patient with a history of brittle diabetes, he has had a recent admission to the hospital around 02/25/2015 with onset of confusion, blood sugar 405. The patient is being evaluated for this confusion.  This is a routine EEG. No skull defects are noted. Medications include ibuprofen, insulin, metformin, Lyrica, and vitamin B12.   EEG classification: Normal awake  Description of the recording: The background rhythms of this recording consists of a fairly well modulated medium amplitude alpha rhythm of 9 Hz that is reactive to eye opening and closure. As the record progresses, the patient appears to remain in the waking state throughout the recording. Photic stimulation was performed, resulting in a bilateral and symmetric photic driving response. Hyperventilation was also performed, resulting in a minimal buildup of the background rhythm activities without significant slowing seen. At no time during the recording does there appear to be evidence of spike or spike wave discharges or evidence of focal slowing. EKG monitor shows no evidence of cardiac rhythm abnormalities with a heart rate of 84.  Impression: This is a normal EEG recording in the waking state. No evidence of ictal or interictal discharges are seen.

## 2015-03-10 NOTE — Telephone Encounter (Signed)
Patient is calling as he is having an EEG today with Loraine @ 3:45 and he requested to see Dr. Lucia Gaskins as he is having constant severe headaches from ears to neck and states he can't stand it anymore.  He asked to see the doctor when he comes in today.  I told him I would send you the message and you would let him know if he can be seen or you could call him.  Thanks!

## 2015-03-11 ENCOUNTER — Other Ambulatory Visit: Payer: Self-pay | Admitting: Neurology

## 2015-03-11 ENCOUNTER — Telehealth: Payer: Self-pay | Admitting: Neurology

## 2015-03-11 LAB — C-REACTIVE PROTEIN: CRP: 2.8 mg/L (ref 0.0–4.9)

## 2015-03-11 LAB — SEDIMENTATION RATE: SED RATE: 6 mm/h (ref 0–30)

## 2015-03-11 MED ORDER — CYCLOBENZAPRINE HCL 5 MG PO TABS: 5.0000 mg | ORAL_TABLET | Freq: Three times a day (TID) | ORAL | Status: AC | PRN

## 2015-03-11 NOTE — Telephone Encounter (Signed)
I ordered the flexeril. Please le thim know t may be sedating, he should not drive or do anything when he first takes untl he knows how he reacts. Thanks.

## 2015-03-11 NOTE — Telephone Encounter (Signed)
Spoke with him. He has had these  Around the bilateral temples for years. Nothing new, no vision changes, no jaw claudication, no other symptoms. I did check an esr and crp today.

## 2015-03-11 NOTE — Telephone Encounter (Signed)
Patient is calling as his headaches are worse.  Please call him.  Thanks!

## 2015-03-11 NOTE — Telephone Encounter (Signed)
Spoke w/ pt and he stated he talked w/ Dr. Lucia Gaskins yesterday about Flexeril at that time and declined wanting to try it. He has changed his mind and wants to try Flexeril because "He is willing to try anything at this point". Told him I will let Dr. Lucia Gaskins know and call it in if she approves. He verbalized understanding.  Told pt his EEG results were normal. He verbalized understanding.

## 2015-03-11 NOTE — Telephone Encounter (Signed)
Spoke w/ pt and told him Dr. Lucia Gaskins called in flexeril to [facility] on [address]. Told him it may be sedating and he should not drive or do anything the first time he takes it until he knows how he will react. He verbalized understanding.

## 2015-03-12 ENCOUNTER — Telehealth: Payer: Self-pay | Admitting: *Deleted

## 2015-03-12 NOTE — Telephone Encounter (Signed)
Spoke w/ pt about normal lab results. Pt verbalized understanding.  

## 2015-03-14 ENCOUNTER — Telehealth: Payer: Self-pay | Admitting: Neurology

## 2015-03-14 DIAGNOSIS — R519 Headache, unspecified: Secondary | ICD-10-CM | POA: Insufficient documentation

## 2015-03-14 DIAGNOSIS — R51 Headache: Secondary | ICD-10-CM

## 2015-03-14 DIAGNOSIS — G44221 Chronic tension-type headache, intractable: Secondary | ICD-10-CM

## 2015-03-14 DIAGNOSIS — F411 Generalized anxiety disorder: Secondary | ICD-10-CM | POA: Insufficient documentation

## 2015-03-14 MED ORDER — CLONAZEPAM 0.5 MG PO TABS: 0.5000 mg | ORAL_TABLET | Freq: Two times a day (BID) | ORAL | Status: AC | PRN

## 2015-03-14 MED ORDER — BUTALBITAL-APAP-CAFFEINE 50-325-40 MG PO TABS: 1.0000 | ORAL_TABLET | Freq: Two times a day (BID) | ORAL | Status: DC | PRN

## 2015-03-14 NOTE — Telephone Encounter (Signed)
Pt called stated that he was recently admitted for hyperglycemia with encephalopathy. Discharged home and saw Dr. Lucia Gaskins on 03/05/15. Repeat EEG normal. Pending MRI to be done.   However, since discharge, he has headache and getting worse day by day. The headache at whole head, pressure like. He has tramendous stress lately. He lost his job due to hospitalization, his wife found to have cancer and need treatment, he is the only bread-earner in the family, both of them stressed out, and he starts crying over the phone.   He is on lyrica  bid, but not effective. He has tried tylenol and advil, but not effective at all. I told him that his anxiety and stress are the trigger and fuel for his HA, he has to lower his stress level in order to feel better. He said "it is easy said then done". I agree with him, but still he needs to do that. At the meantime, I will order fioricet (10 tablets) and clonazepam (14 tablets) for him to help him out. He will call back on Monday to check with Dr. Lucia Gaskins.   Marvel Plan, MD PhD Stroke Neurology 03/14/2015 5:57 PM

## 2015-03-15 ENCOUNTER — Other Ambulatory Visit: Payer: Self-pay | Admitting: Neurology

## 2015-03-15 NOTE — Telephone Encounter (Signed)
Thank you Jindong, I appreciate your help with this patient.   Like I told this patient when he came into the office for lab work and stopped me in the hallway, he has to call his primary care for his headaches or see me in the office.  We did not discuss headaches during his one appointment. I can't perform a history of his headaches over the phone and I cannot treat him without a history of his headaches. I am on vacation for the next few weeks and so he HAS to see his primary care or he has to schedule a follow up appointment with me when I am back in the office. I tried calling to explain this to him again but his mailbox was full. If his headaches are severe he needs to go to the emergency room.   Kara Mead - please call him and explain I am out of the office and we cannot treat his headaches since we have never evaluated him for headaches. He has to see his primary care physician for his headaches or go to the ED, ask him to call his pcp. We can discuss headaches at a follow up appointment in the office.  We will not prescribe anymore medication over the phone for this patient.  Thank you

## 2015-03-15 NOTE — Telephone Encounter (Signed)
I called patient. Spoke to him. I explained again, we cannot treat his headaches over the phone and my office will not prescribe medication if he has not been evaluated for his headaches in our office. It is not appropriate patient care.  He is welcome to see me in the office for evaluation or he can see his primary care. If headache is severe, he can proceed to the emergency room. Thanks.

## 2015-03-27 ENCOUNTER — Emergency Department (HOSPITAL_COMMUNITY): Payer: 59

## 2015-03-27 ENCOUNTER — Emergency Department (HOSPITAL_COMMUNITY)
Admission: EM | Admit: 2015-03-27 | Discharge: 2015-03-27 | Disposition: A | Discharge status: Home / Self Care | Payer: 59 | Attending: Emergency Medicine | Admitting: Emergency Medicine

## 2015-03-27 ENCOUNTER — Telehealth: Payer: Self-pay | Admitting: Neurology

## 2015-03-27 ENCOUNTER — Encounter (HOSPITAL_COMMUNITY): Payer: Self-pay | Admitting: Emergency Medicine

## 2015-03-27 DIAGNOSIS — G40909 Epilepsy, unspecified, not intractable, without status epilepticus: Secondary | ICD-10-CM | POA: Diagnosis not present

## 2015-03-27 DIAGNOSIS — G8929 Other chronic pain: Secondary | ICD-10-CM | POA: Diagnosis not present

## 2015-03-27 DIAGNOSIS — Z794 Long term (current) use of insulin: Secondary | ICD-10-CM | POA: Insufficient documentation

## 2015-03-27 DIAGNOSIS — H6121 Impacted cerumen, right ear: Secondary | ICD-10-CM | POA: Diagnosis not present

## 2015-03-27 DIAGNOSIS — E119 Type 2 diabetes mellitus without complications: Secondary | ICD-10-CM | POA: Diagnosis not present

## 2015-03-27 DIAGNOSIS — Z79899 Other long term (current) drug therapy: Secondary | ICD-10-CM | POA: Insufficient documentation

## 2015-03-27 DIAGNOSIS — M170 Bilateral primary osteoarthritis of knee: Secondary | ICD-10-CM | POA: Insufficient documentation

## 2015-03-27 DIAGNOSIS — M25561 Pain in right knee: Secondary | ICD-10-CM

## 2015-03-27 DIAGNOSIS — R2243 Localized swelling, mass and lump, lower limb, bilateral: Secondary | ICD-10-CM | POA: Insufficient documentation

## 2015-03-27 DIAGNOSIS — Z720 Tobacco use: Secondary | ICD-10-CM | POA: Insufficient documentation

## 2015-03-27 HISTORY — DX: Pain in unspecified knee: M25.569

## 2015-03-27 LAB — CBG MONITORING, ED: Glucose-Capillary: 168 mg/dL — ABNORMAL HIGH (ref 65–99)

## 2015-03-27 LAB — CBC WITH DIFFERENTIAL/PLATELET
BASOS ABS: 0 10*3/uL (ref 0.0–0.1)
BASOS PCT: 1 % (ref 0–1)
EOS ABS: 0.2 10*3/uL (ref 0.0–0.7)
EOS PCT: 4 % (ref 0–5)
HEMATOCRIT: 44.9 % (ref 39.0–52.0)
Hemoglobin: 14.6 g/dL (ref 13.0–17.0)
Lymphocytes Relative: 33 % (ref 12–46)
Lymphs Abs: 1.9 10*3/uL (ref 0.7–4.0)
MCH: 29.1 pg (ref 26.0–34.0)
MCHC: 32.5 g/dL (ref 30.0–36.0)
MCV: 89.6 fL (ref 78.0–100.0)
MONO ABS: 0.4 10*3/uL (ref 0.1–1.0)
MONOS PCT: 6 % (ref 3–12)
Neutro Abs: 3.3 10*3/uL (ref 1.7–7.7)
Neutrophils Relative %: 56 % (ref 43–77)
Platelets: 173 10*3/uL (ref 150–400)
RBC: 5.01 MIL/uL (ref 4.22–5.81)
RDW: 15.1 % (ref 11.5–15.5)
WBC: 5.8 10*3/uL (ref 4.0–10.5)

## 2015-03-27 LAB — BASIC METABOLIC PANEL
Anion gap: 8 (ref 5–15)
BUN: 13 mg/dL (ref 6–20)
CO2: 28 mmol/L (ref 22–32)
Calcium: 8.8 mg/dL — ABNORMAL LOW (ref 8.9–10.3)
Chloride: 103 mmol/L (ref 101–111)
Creatinine, Ser: 0.71 mg/dL (ref 0.61–1.24)
GFR calc Af Amer: >60 mL/min (ref >60)
GFR calc non Af Amer: >60 mL/min (ref >60)
Glucose, Bld: 204 mg/dL — ABNORMAL HIGH (ref 65–99)
POTASSIUM: 4.3 mmol/L (ref 3.5–5.1)
Sodium: 139 mmol/L (ref 135–145)

## 2015-03-27 MED ORDER — CARBAMIDE PEROXIDE 6.5 % OT SOLN
5.0000 [drp] | Freq: Once | OTIC | Status: AC
  Administered 2015-03-27: 5 [drp] via OTIC
  Filled 2015-03-27: qty 15

## 2015-03-27 MED ORDER — HYDROCODONE-ACETAMINOPHEN 5-325 MG PO TABS: 1.0000 | ORAL_TABLET | Freq: Four times a day (QID) | ORAL | Status: AC | PRN

## 2015-03-27 MED ORDER — HYDROCODONE-ACETAMINOPHEN 5-325 MG PO TABS
1.0000 | ORAL_TABLET | Freq: Once | ORAL | Status: AC
  Administered 2015-03-27: 1 via ORAL
  Filled 2015-03-27: qty 1

## 2015-03-27 MED ORDER — DOCUSATE SODIUM 50 MG/5ML PO LIQD: 10.0000 mg | Freq: Once | ORAL | Status: DC

## 2015-03-27 NOTE — ED Notes (Signed)
Pt has hx of chronic bilateral knee pain. Non compliant diabetic and recent hospitalization for diabetic coma+

## 2015-03-27 NOTE — Telephone Encounter (Signed)
Spoke w/ pt and again explained to him we cannot treat his headaches over the phone and he needs to come into the office to be seen by Dr. Lucia Gaskins. He replied "He misunderstood Dr. Lucia Gaskins and did not realize he needed to make an appointment". I reiterated that Dr. Lucia Gaskins spoke with him many times and wanted him to come in for appointment. I advised that he needed to go to the emergency room per Dr. Lucia Gaskins advise due to new symptoms of hearing loss in right ear and his headaches being so bad. I asked if he had someone that could go with him and he stated he did. I offered to set up appointment with him while I had him on the phone and he declined saying "I need to check with work and call you back about that". I told him to call back to make f/u appointment. He also stated he has not heard anything about scheduling his MRI. I told him I would check with Sue Lush who schedules those and he should be receiving a call from her. At this time, he needed to go to the emergency room and get evaluated. They could possibly do MRI there when he goes. He verbalized understanding.

## 2015-03-27 NOTE — ED Notes (Signed)
Pt reports hearing loss x 10 day. Chronic r/knee pain increasing over last 2 weeks. C/o headache pain since last hospitalization. Pt was dropped off by son.

## 2015-03-27 NOTE — ED Provider Notes (Signed)
CSN: [id number]     Arrival date & time 03/27/15  1104 History   First MD Initiated Contact with Patient 03/27/15 1121     Chief Complaint  Patient presents with  . Hearing Loss    10 day hx of hearing loss  . Headache  . Knee Pain     Patient is a 59 y.o. male presenting with headaches and knee pain. The history is provided by the patient. No language interpreter was used.  Headache Knee Pain  Charles Blevins presents for evaluation of multiple complaints. He states that he was hospitalized 3-4 weeks ago for diabetic, and when he awoke at that time he had a headache. His headache is bitemporal and radiates to the posterior neck. This has been ongoing and old for the last 3-4 weeks. He also reports about 10 days of decreased hearing in the right ear. And he has about one week of right knee pain. He denies any injuries, numbness, weakness, fevers, vomiting, vision changes. He talked to his neurologist and they recommended an MRI.  Past Medical History  Diagnosis Date  . Back pain   . Diabetes mellitus without complication   . Fibromyalgia   . Seizures   . Arthritis, bil knees with chronic pain 11/07/2013  . Knee pain    Past Surgical History  Procedure Laterality Date  . Knee surgery     Family History  Problem Relation Age of Onset  . Diabetes Mother   . Leukemia Father 50  . Healthy Sister   . Healthy Brother   . Healthy Sister   . Healthy Brother   . Healthy Brother   . Healthy Brother   . Seizures Neg Hx    Social History  Substance Use Topics  . Smoking status: Current Every Day Smoker -- 1.00 packs/day    Types: Cigarettes  . Smokeless tobacco: Never Used  . Alcohol Use: No    Review of Systems  Neurological: Positive for headaches.  All other systems reviewed and are negative.     Allergies  Review of patient's allergies indicates no known allergies.  Home Medications   Prior to Admission medications   Medication Sig Start Date End Date Taking?  Authorizing Provider  Blood Glucose Monitoring Suppl (ONETOUCH VERIO) W/DEVICE KIT  03/03/15   Historical Provider, MD  clonazePAM (KLONOPIN) 0.5 MG tablet Take 1 tablet (0.5 mg total) by mouth 2 (two) times daily as needed for anxiety. 03/14/15   Rosalin Hawking, MD  cyclobenzaprine (FLEXERIL) 5 MG tablet Take 1 tablet (5 mg total) by mouth every 8 (eight) hours as needed for muscle spasms. 03/11/15   Melvenia Beam, MD  ibuprofen (ADVIL,MOTRIN) 200 MG tablet Take 2 tablets (400 mg total) by mouth every 6 (six) hours as needed for moderate pain. 02/28/15   Cherene Altes, MD  LANTUS SOLOSTAR 100 UNIT/ML Solostar Pen Inject 55 Units into the skin daily. 03/01/15   Cherene Altes, MD  metFORMIN (GLUCOPHAGE) 1000 MG tablet Take 500 mg by mouth 2 (two) times daily with a meal.     Historical Provider, MD  oxymetazoline (AFRIN) 0.05 % nasal spray Place 1 spray into both nostrils every morning.    Historical Provider, MD  pregabalin (LYRICA) 300 MG capsule Take 1 capsule (300 mg total) by mouth 2 (two) times daily. 03/05/15   Melvenia Beam, MD  vitamin B-12 (CYANOCOBALAMIN) 1000 MCG tablet Take 1 tablet (1,000 mcg total) by mouth daily. Patient not taking: Reported on 03/05/2015  02/28/15   Cherene Altes, MD   BP 110/75 mmHg  Pulse 78  Temp(Src) 98 F (36.7 C) (Oral)  Wt 220 lb (99.791 kg)  SpO2 98% Physical Exam  Constitutional: He is oriented to person, place, and time. He appears well-developed and well-nourished.  HENT:  Head: Normocephalic and atraumatic.  Right ear with cerumen impaction, left TM without erythema  Eyes: EOM are normal. Pupils are equal, round, and reactive to light.  Cardiovascular: Normal rate and regular rhythm.   No murmur heard. Pulmonary/Chest: Effort normal and breath sounds normal. No respiratory distress.  Abdominal: Soft. There is no tenderness. There is no rebound and no guarding.  Musculoskeletal:  1-2+ pitting edema in bilateral lower extremities. There is  tenderness throughout the right knee without any appreciable effusion. Patient does have pain with flexion of the right knee.  Neurological: He is alert and oriented to person, place, and time.  No facial asymmetry, sensation to light touch intact throughout face, all 4 extremities. Subjective decreased hearing in the right ear.  Skin: Skin is warm and dry.  Psychiatric: He has a normal mood and affect. His behavior is normal.  Nursing note and vitals reviewed.   ED Course  Procedures (including critical care time)  Ceruminosis is noted.  Wax is removed by syringing and manual debridement. Instructions for home care to prevent wax buildup are given.  Labs Review Labs Reviewed  BASIC METABOLIC PANEL - Abnormal; Notable for the following:    Glucose, Bld 204 (*)    Calcium 8.8 (*)    All other components within normal limits  CBG MONITORING, ED - Abnormal; Notable for the following:    Glucose-Capillary 168 (*)    All other components within normal limits  CBC WITH DIFFERENTIAL/PLATELET    Imaging Review Dg Knee Complete 4 Views Right  03/27/2015   CLINICAL DATA:  Chronic right medial and posterior knee pain with increased symptoms over the past 2 weeks without known injury, history of diabetes, fibromyalgia, and arthritis.  EXAM: RIGHT KNEE - COMPLETE 4+ VIEW  COMPARISON:  MRI of the right knee of January 07/2015  FINDINGS: The bones of the knee are adequately mineralized. There is mild narrowing of the medial joint compartment. There is no chondrocalcinosis. There tiny marginal spurs from the periphery of the femoral condyles. The proximal fibula is intact. The patella appears appropriately positioned in the patellar groove of the distal femur. There osteophytes associated with the medial femoral condyles anterior surface and the adjacent medial aspect of the patella. There is no significant joint effusion.  IMPRESSION: There are mild osteoarthritic changes involving principally the medial  and patellofemoral compartments. There is no acute bony abnormality.   Electronically Signed   By: David  Martinique M.D.   On: 03/27/2015 13:15     EKG Interpretation None      MDM   Final diagnoses:  Cerumen impaction, right  Right knee pain    Patient here for evaluation of multiple complaints. He states he has decreased hearing in his right ear, examination demonstrates sermon impaction. His hearing is improved after cerumen removal emergency department. He has no focal neurologic deficits on exam. This is not consistent with space-occupying lesion or CVA. Patient has chronic headaches and his neurologist wanted an MRI, ordered on 03/05/15, discussed the patient option of obtaining MRI and emergency department he declines at this time. Discussed the patient outpatient follow-up with neurology for outpatient MRI is initially planned. He also complains of right knee  pain that has been ongoing for the last several weeks. He is tender to palpation on examination with slight decrease in his range of motion. There is no evidence of acute fracture, infectious process, gout, acute ligamentous injury. Discussed outpatient homecare for knee with orthopedics follow-up.    Quintella Reichert, MD 03/28/15 [phone]

## 2015-03-27 NOTE — Telephone Encounter (Signed)
Patient called again today regarding worsening headaches and loss of hearing in one ear. I have had multiple conversations with patient to see me in the office. I have never evaluated him for headaches, he keeps calling about them and I keep strongly urging him to come into the office to see me. I have offered to schedule him for an appt and he has said he will call back.  My nurse has spoke with him and offered to schedule an appointment and he also declined. I can't evaluate this over the phone. He has failed to follow up with me. Since headaches are worsening with loss of hearing I advised him to go to the emergency room and, again, follow up with me in the office.

## 2015-03-27 NOTE — Telephone Encounter (Signed)
Pt called regarding losing hearing in right ear x 1 week. Has had bad headaches.

## 2015-03-27 NOTE — Discharge Instructions (Signed)
Cerumen Impaction A cerumen impaction is when the wax in your ear forms a plug. This plug usually causes reduced hearing. Sometimes it also causes an earache or dizziness. Removing a cerumen impaction can be difficult and painful. The wax sticks to the ear canal. The canal is sensitive and bleeds easily. If you try to remove a heavy wax buildup with a cotton tipped swab, you may push it in further. Irrigation with water, suction, and small ear curettes may be used to clear out the wax. If the impaction is fixed to the skin in the ear canal, ear drops may be needed for a few days to loosen the wax. People who build up a lot of wax frequently can use ear wax removal products available in your local drugstore. SEEK MEDICAL CARE IF:  You develop an earache, increased hearing loss, or marked dizziness. Document Released: 09/09/2004 Document Revised: 10/25/2011 Document Reviewed: 10/30/2009 [facility] Patient Information 2015 [place], [place]. This information is not intended to replace advice given to you by your health care provider. Make sure you discuss any questions you have with your health care provider.  Arthralgia Your caregiver has diagnosed you as suffering from an arthralgia. Arthralgia means there is pain in a joint. This can come from many reasons including:  Bruising the joint which causes soreness (inflammation) in the joint.  Wear and tear on the joints which occur as we grow older (osteoarthritis).  Overusing the joint.  Various forms of arthritis.  Infections of the joint. Regardless of the cause of pain in your joint, most of these different pains respond to anti-inflammatory drugs and rest. The exception to this is when a joint is infected, and these cases are treated with antibiotics, if it is a bacterial infection. HOME CARE INSTRUCTIONS   Rest the injured area for as long as directed by your caregiver. Then slowly start using the joint as directed by your caregiver and as the  pain allows. Crutches as directed may be useful if the ankles, knees or hips are involved. If the knee was splinted or casted, continue use and care as directed. If an stretchy or elastic wrapping bandage has been applied today, it should be removed and re-applied every 3 to 4 hours. It should not be applied tightly, but firmly enough to keep swelling down. Watch toes and feet for swelling, bluish discoloration, coldness, numbness or excessive pain. If any of these problems (symptoms) occur, remove the ace bandage and re-apply more loosely. If these symptoms persist, contact your caregiver or return to this location.  For the first 24 hours, keep the injured extremity elevated on pillows while lying down.  Apply ice for 15-20 minutes to the sore joint every couple hours while awake for the first half day. Then 03-04 times per day for the first 48 hours. Put the ice in a plastic bag and place a towel between the bag of ice and your skin.  Wear any splinting, casting, elastic bandage applications, or slings as instructed.  Only take over-the-counter or prescription medicines for pain, discomfort, or fever as directed by your caregiver. Do not use aspirin immediately after the injury unless instructed by your physician. Aspirin can cause increased bleeding and bruising of the tissues.  If you were given crutches, continue to use them as instructed and do not resume weight bearing on the sore joint until instructed. Persistent pain and inability to use the sore joint as directed for more than 2 to 3 days are warning signs indicating  that you should see a caregiver for a follow-up visit as soon as possible. Initially, a hairline fracture (break in bone) may not be evident on X-rays. Persistent pain and swelling indicate that further evaluation, non-weight bearing or use of the joint (use of crutches or slings as instructed), or further X-rays are indicated. X-rays may sometimes not show a small fracture until  a week or 10 days later. Make a follow-up appointment with your own caregiver or one to whom we have referred you. A radiologist (specialist in reading X-rays) may read your X-rays. Make sure you know how you are to obtain your X-ray results. Do not assume everything is normal if you do not hear from [place]. SEEK MEDICAL CARE IF: Bruising, swelling, or pain increases. SEEK IMMEDIATE MEDICAL CARE IF:   Your fingers or toes are numb or blue.  The pain is not responding to medications and continues to stay the same or get worse.  The pain in your joint becomes severe.  You develop a fever over 102 F (38.9 C).  It becomes impossible to move or use the joint. MAKE SURE YOU:   Understand these instructions.  Will watch your condition.  Will get help right away if you are not doing well or get worse. Document Released: 08/02/2005 Document Revised: 10/25/2011 Document Reviewed: 03/20/2008 [facility] Patient Information 2015 [place], [place]. This information is not intended to replace advice given to you by your health care provider. Make sure you discuss any questions you have with your health care provider.

## 2015-03-27 NOTE — Telephone Encounter (Signed)
Called/LMOM for pt to return my call. I have called and left messages on 7/20 & 7/25 to schedule prior.

## 2015-03-27 NOTE — Telephone Encounter (Signed)
Pt is returning Dr. Trevor Mace call

## 2015-03-28 NOTE — Telephone Encounter (Signed)
I called patient and left a message for him again this morning.

## 2015-04-08 ENCOUNTER — Ambulatory Visit: Payer: 59 | Admitting: Neurology

## 2015-04-09 ENCOUNTER — Other Ambulatory Visit: Payer: 59

## 2015-04-15 ENCOUNTER — Ambulatory Visit: Payer: 59 | Admitting: Neurology

## 2015-04-16 ENCOUNTER — Other Ambulatory Visit: Payer: 59

## 2015-04-17 ENCOUNTER — Ambulatory Visit: Payer: 59 | Admitting: Family Medicine

## 2015-04-24 ENCOUNTER — Ambulatory Visit: Payer: 59 | Admitting: Neurology

## 2015-04-28 ENCOUNTER — Encounter: Payer: Self-pay | Admitting: Neurology

## 2015-04-30 ENCOUNTER — Other Ambulatory Visit: Payer: 59

## 2015-08-28 ENCOUNTER — Telehealth: Payer: Self-pay | Admitting: Neurology

## 2015-08-28 NOTE — Telephone Encounter (Signed)
Pt called requesting refill for pregabalin (LYRICA) 300 MG capsule . Pt said it needs PA.

## 2015-08-28 NOTE — Telephone Encounter (Signed)
I called the pharmacy and spoke with Pharmacist, Olegario MessierKathy.  She said the Lyrica is refill too soon, last filled for 30 day supply on 12/23, so this is why ins is rejecting.  Says they do not show a prior auth is needed at this time, and will not need a new Rx until med is due to be filled.  I called the patient back to relay this info.  Got no answer.  Left message.

## 2015-08-29 ENCOUNTER — Other Ambulatory Visit: Payer: Self-pay | Admitting: Neurology

## 2015-09-01 ENCOUNTER — Other Ambulatory Visit: Payer: Self-pay

## 2015-09-01 MED ORDER — PREGABALIN 300 MG PO CAPS: 300.0000 mg | ORAL_CAPSULE | Freq: Two times a day (BID) | ORAL | Status: DC

## 2015-09-01 NOTE — Telephone Encounter (Signed)
Pt is calling for a refill on Lyrica. He was told that it might be to early. May call pt [phone]

## 2015-09-02 ENCOUNTER — Encounter: Payer: Self-pay | Admitting: *Deleted

## 2015-09-02 NOTE — Progress Notes (Signed)
Faxed printed rx Lyrica  to pt pharmacy. Received confirmation.

## 2015-09-08 ENCOUNTER — Telehealth: Payer: Self-pay | Admitting: Neurology

## 2015-09-08 ENCOUNTER — Ambulatory Visit: Payer: 59 | Admitting: Neurology

## 2015-09-08 NOTE — Telephone Encounter (Signed)
Called pt back. Relayed message below per Dr Lucia Gaskins. He verbalized understanding. He stated right butt cheek has decreased feeling. Denies left sided numbness. Taking lyrica  2 times daily. [employer] would not pay for 3 times daily  and only covering for 60 tablets. He is used to taking it 3 times daily. His insurance changed back to [facility]. He is wanting to go back to 90 per month so he can go back to 3 times daily. Pain mainly in hands and not feet. Hand is cramping up. He is going to keep appt for Thursday per Dr Lucia Gaskins request. Told him I will speak to Dr Lucia Gaskins about medication and call him back to advise. He may need to wait until Thursday to discuss w/ Dr Lucia Gaskins. He understands.

## 2015-09-08 NOTE — Telephone Encounter (Signed)
Per Dr Lucia Gaskins-  She would like to wait to speak to pt about medication change when he comes for appt on Thursday.

## 2015-09-08 NOTE — Telephone Encounter (Signed)
Patient called requesting a phone call back from [facility] regarding appointments and "fibromyalgia, hands really sore, pain won't go away".

## 2015-09-08 NOTE — Telephone Encounter (Signed)
Called pt back and relayed message below. He verbalized understanding. He will come for appt on Thursday. Knows to check in no later than 815am.

## 2015-09-08 NOTE — Telephone Encounter (Signed)
Per Dr Lucia Gaskins- pt will have to keep appt for that time or r/s for next week. He has missed many appt and r/s.

## 2015-09-11 ENCOUNTER — Encounter: Payer: Self-pay | Admitting: Neurology

## 2015-09-11 ENCOUNTER — Ambulatory Visit (INDEPENDENT_AMBULATORY_CARE_PROVIDER_SITE_OTHER): Payer: BLUE CROSS/BLUE SHIELD | Admitting: Neurology

## 2015-09-11 VITALS — BP 155/89 | HR 85 | Ht 67.0 in | Wt 239.4 lb

## 2015-09-11 DIAGNOSIS — M5416 Radiculopathy, lumbar region: Secondary | ICD-10-CM | POA: Diagnosis not present

## 2015-09-11 DIAGNOSIS — E0842 Diabetes mellitus due to underlying condition with diabetic polyneuropathy: Secondary | ICD-10-CM | POA: Diagnosis not present

## 2015-09-11 DIAGNOSIS — G4441 Drug-induced headache, not elsewhere classified, intractable: Secondary | ICD-10-CM

## 2015-09-11 DIAGNOSIS — G4440 Drug-induced headache, not elsewhere classified, not intractable: Secondary | ICD-10-CM

## 2015-09-11 MED ORDER — AMITRIPTYLINE HCL 25 MG PO TABS: 25.0000 mg | ORAL_TABLET | Freq: Every day | ORAL | Status: AC

## 2015-09-11 MED ORDER — PREGABALIN 300 MG PO CAPS: 300.0000 mg | ORAL_CAPSULE | Freq: Two times a day (BID) | ORAL | Status: AC

## 2015-09-11 NOTE — Progress Notes (Signed)
[facility]    Provider:  Dr Jaynee Eagles Referring Provider: [name], MD Primary Care Physician:  Wonda Cerise, MD  CC: Alteration of consciousness  Interval history 09/11/2015:  He was seen initially for an episode of confusion when his glucose was over 600. Today his hands are in pain. He has neuropathy and the feet are doing better on the Lyrica. He has numbness and tingling in the hands. All the fingers. Has been going on for a month. Offered an emg/ncs to eval for other causes and he declined. Tingling in the hands. All the fingers. He has had uncontrolled diabetes and he just thinks his hands are an extension of this. He is trying to control his glucose better. He thinks his last Hgba1c was 12 or around there. He says he had a prescription for amitriptyline and was taking it for headaches from his old neurologist in [place] and we discussed restarting this. (Reviewed EKG tracing, QTC 427). He would like to restart the Amitriptyline. He has had chronic headaches for years, not changing, in the center of the head, he can't describe it and he takes ibuprofen for the headaches daily, discussed rebound headaches and medication overuse headache, no migrainous features,  He has the headaches daily. Discussed side effects from the amitriptyline. His wife has cancer and it is a stressful time for him right now. Discussed making sure he gets 8 hours of sleep if he takes amitriptyline and taking it every night.  He has LBP with radiation into the right buttocks. His right buttocks is completely numb. Severe pain that radiates from the back to the right buttocks and then stops. It is severe shooting pain.   HPI 03/05/2015: Charles Blevins is a 60 y.o. male here as a referral from Dr. Ernie Hew for episode of confusion. Past medical history of back pain, diabetes mellitus, fibromyalgia, alterations of consciousness/questionable history of seizure disorder.   He was in the ED and was  confused. His blood glucose was 600. Son thought he went crazy. He has problems maintaining glucose levels. And sometimes levels get too low and gets shakiy. Son provides most information. He was doing things that made no sense. He was doing things but had no idea what was going on, non-purposeful tasks. He was in the ED on July 13th. He was oriented only to person. This has never happened. In the past when he was shaky, never had LOC or altered mentation with the shakes. He says he has a very bad case of Fibromyalgia. No history of alcohol use. He is having memory problems but it has been doing well for the last few. Sometimes his memory is worse than others. More short-term memory difficulty but not getting worse, stable. Son denies history of memory changes, or forgetfullness. No fhx of sizures.   Reviewed notes, labs and imaging from outside physicians, which showed: He presented on July 12 to the emergency room is of altered mentation. Patient's wife noted he was confused about 5 AM was alert to person only. Serum glucose was 405 with a CO2 of 19 and an anion gap of 18.lactic acid 1.85. Urine drug screen +opiates (not prescribed recently).  EEG was abnormal secondary to generalized slowing. No epileptiform activity was noted.  CT head showed No acute intracranial abnormalities including mass lesion or mass effect, hydrocephalus   Review of Systems: Patient complains of symptoms per HPI as well as the following symptoms: No CP, no SOB. Pertinent negatives per HPI. All others  negative.   Social History   Social History  . Marital Status: Married    Spouse Name: N/A  . Number of Children: 4  . Years of Education: 14   Occupational History  . Not on file.   Social History Main Topics  . Smoking status: Current Every Day Smoker -- 1.00 packs/day    Types: Cigarettes  . Smokeless tobacco: Never Used  . Alcohol Use: No  . Drug Use: No  . Sexual Activity: Not on file   Other Topics  Concern  . Not on file   Social History Narrative   Lives at home with son and wife.    Caffeine use: 2 cups coffee/day       Family History  Problem Relation Age of Onset  . Diabetes Mother   . Leukemia Father 60  . Healthy Sister   . Healthy Brother   . Healthy Sister   . Healthy Brother   . Healthy Brother   . Healthy Brother   . Seizures Neg Hx     Past Medical History  Diagnosis Date  . Back pain   . Diabetes mellitus without complication ([place])   . Fibromyalgia   . Seizures ([place])   . Arthritis, bil knees with chronic pain 11/07/2013  . Knee pain     Past Surgical History  Procedure Laterality Date  . Knee surgery      Current Outpatient Prescriptions  Medication Sig Dispense Refill  . atorvastatin (LIPITOR) 10 MG tablet Take 10 mg by mouth daily.  4  . Blood Glucose Monitoring Suppl (ONETOUCH VERIO) W/DEVICE KIT     . ibuprofen (ADVIL,MOTRIN) 800 MG tablet Take 800 mg by mouth every 8 (eight) hours as needed for headache, mild pain or moderate pain.    Marland Kitchen insulin aspart (NOVOLOG FLEXPEN) 100 UNIT/ML FlexPen Inject 10 Units into the skin 3 (three) times daily with meals.    Marland Kitchen LANTUS SOLOSTAR 100 UNIT/ML Solostar Pen Inject 55 Units into the skin daily. (Patient taking differently: Inject 75 Units into the skin daily. ) 15 mL 3  . metFORMIN (GLUCOPHAGE) 1000 MG tablet Take 1,000 mg by mouth 2 (two) times daily with a meal.     . ONETOUCH DELICA LANCETS 16R MISC use to test blood sugar 1 to 3 times daily  5  . ONETOUCH VERIO test strip CHECK BLOOD SUGAR 3 TIMES DAILY  10  . oxymetazoline (AFRIN) 0.05 % nasal spray Place 1 spray into both nostrils every morning.    . pregabalin (LYRICA) 300 MG capsule Take 1 capsule (300 mg total) by mouth 2 (two) times daily. 60 capsule 1  . ULTRA-THIN II PEN NEEDLES 29G X 12.7MM MISC USE TO INJECT NOVOLOG AND LANTUS AS DIRECTED  10  . vitamin B-12 (CYANOCOBALAMIN) 1000 MCG tablet Take 1 tablet (1,000 mcg total) by mouth daily.    .  clonazePAM (KLONOPIN) 0.5 MG tablet Take 1 tablet (0.5 mg total) by mouth 2 (two) times daily as needed for anxiety. (Patient not taking: Reported on 03/27/2015) 14 tablet 0  . cyclobenzaprine (FLEXERIL) 5 MG tablet Take 1 tablet (5 mg total) by mouth every 8 (eight) hours as needed for muscle spasms. (Patient not taking: Reported on 03/27/2015) 30 tablet 1  . HYDROcodone-acetaminophen (NORCO/VICODIN) 5-325 MG per tablet Take 1 tablet by mouth every 6 (six) hours as needed. (Patient not taking: Reported on 09/11/2015) 10 tablet 0  . ibuprofen (ADVIL,MOTRIN) 200 MG tablet Take 2 tablets (400 mg total) by  mouth every 6 (six) hours as needed for moderate pain. (Patient not taking: Reported on 03/27/2015) 30 tablet 0   No current facility-administered medications for this visit.    Allergies as of 09/11/2015  . (No Known Allergies)    Vitals: BP 155/89 mmHg  Pulse 85  Ht 5' 7"  (1.702 m)  Wt 239 lb 6.4 oz (108.591 kg)  BMI 37.49 kg/m2 Last Weight:  Wt Readings from Last 1 Encounters:  09/11/15 239 lb 6.4 oz (108.591 kg)   Last Height:   Ht Readings from Last 1 Encounters:  09/11/15 5' 7"  (1.702 m)    Cognition:  The patient is oriented to person, place, and time;   recent and remote memory intact;   language fluent;   normal attention, concentration,   fund of knowledge Cranial Nerves:  The pupils are equal, round, and reactive to light. The fundi are flat. Visual fields are full to finger confrontation. Extraocular movements are intact. Trigeminal sensation is intact and the muscles of mastication are normal. The face is symmetric. The palate elevates in the midline. Hearing intact. Voice is normal. Shoulder shrug is normal. The tongue has normal motion without fasciculations.   Coordination:  Normal finger to nose and heel to shin. Normal rapid alternating movements.   Gait:  Antalgic   Motor Observation:  No asymmetry, no atrophy, and no involuntary  movements noted. Tone:  Normal muscle tone.   Posture:  Posture is normal.    Strength:  Strength is V/V in the upper and lower limbs.    Sensation: intact to LT   Reflex Exam:  DTR's: Absent AJs.  Toes:  The toes are equivocal bilaterally.  Clonus:  Clonus is absent.      Assessment/Plan: 60 year old male with complicated past medical history who presented to the emergency room in July with altered mentation. Likely metabolic encephalopathy due to uncontrolled diabetes, his glucose was over 600 with an anion gap. Hemoglobin A1c 10.9.  Discussed medication compliance. No AEDs at this time. Will refill his Lyrica for his neuropathy and fibromyalgia.  He is on Lyrica and that helps with his diabetic neuropathy. He was on Amitriptyline in the past and it was for headaches from his old neurologist. Will restart Amitriptyline, but likely medication overuse headache due to daily ibuprofen and discussed with patient.   As far as your medications are concerned, I would like to suggest: Amitriptylline 32m at night Continue Lyrica 3014mtwice daily Refer to Dr. RaNelva Bushor epidural steroid injections into the low back.   Discussed side effects Amitriptyline including hypotension, hypertension, syncope, ventricular arrhythmias, QT prolongation and other cardiac side effects, stroke and seizures, ataxia tardive dyskinesias, extrapyramidal symptoms, increased intraocular pressure, leukopenia, thrombocytopenia, hallucinations, suicidality and other serious side effects. Common reactions include drowsiness, dry mouth, dizziness, constipation, blurred vision, palpitations, tachycardia, impaired coordination, increased appetite, nausea vomiting, weakness, confusion, disorientation, restlessness, anxiety and other side effects.  Cc: Dr. kaDarlina GuysMD  [facility] [id number] [id number]  [phone] total of 30 minutes was spent face-to-face with this patient. Over half this time was spent on counseling patient on the diabetic neuropathy, Medication Overuse headache, Lumbar radiculopathy diagnosis and different diagnostic and therapeutic options available.

## 2015-09-11 NOTE — Patient Instructions (Signed)
Remember to drink plenty of fluid, eat healthy meals and do not skip any meals. Try to eat protein with a every meal and eat a healthy snack such as fruit or nuts in between meals. Try to keep a regular sleep-wake schedule and try to exercise daily, particularly in the form of walking, 20-30 minutes a day, if you can.   As far as your medications are concerned, I would like to suggest: Amitriptylline  at night Continue Lyrica  twice daily Refer for epidural steroid injections into the low back.   I would like to see you back as needed, sooner if we need to. Please call us with any interim questions, concerns, problems, updates or refill requests.   Our phone number is [phone]. We also have an after hours call service for urgent matters and there is a physician on-call for urgent questions. For any emergencies you know to call 911 or go to the nearest emergency room

## 2015-09-13 ENCOUNTER — Other Ambulatory Visit: Payer: Self-pay | Admitting: Neurology

## 2015-09-14 DIAGNOSIS — G4440 Drug-induced headache, not elsewhere classified, not intractable: Secondary | ICD-10-CM | POA: Insufficient documentation

## 2015-09-14 DIAGNOSIS — E1140 Type 2 diabetes mellitus with diabetic neuropathy, unspecified: Secondary | ICD-10-CM | POA: Insufficient documentation

## 2015-09-14 DIAGNOSIS — M5416 Radiculopathy, lumbar region: Secondary | ICD-10-CM | POA: Insufficient documentation

## 2015-09-16 ENCOUNTER — Telehealth: Payer: Self-pay | Admitting: Neurology

## 2015-09-16 NOTE — Telephone Encounter (Signed)
No, he should call the physician who prescribed that for him. I don't prescribe Klonopin, thanks

## 2015-09-16 NOTE — Telephone Encounter (Signed)
I called the pharmacy back to relay providers message.  Spoke with [place].  She expressed understanding.

## 2015-09-16 NOTE — Telephone Encounter (Signed)
[facility] called to check on status of refill for   clonazePAM (KLONOPIN) 0.5 MG tablet . May call if questions

## 2015-09-16 NOTE — Telephone Encounter (Signed)
Pharmacy is requesting a Rx for Clonazepam 0.5mg  tabs one twice daily as needed on behalf of the patient.  Would you like to prescribe?  Thank you!

## 2015-09-16 NOTE — Telephone Encounter (Signed)
error 

## 2015-10-01 ENCOUNTER — Other Ambulatory Visit: Payer: Self-pay | Admitting: Neurology

## 2015-10-08 ENCOUNTER — Telehealth: Payer: Self-pay | Admitting: Neurology

## 2015-10-08 NOTE — Telephone Encounter (Signed)
[facility] has reached out to patient multiple times left messages on both lines and patient has not called back.  I called patient and left him a message relaying to call me or [facility] at [phone] to schedule apt. If he still wanted to go.

## 2015-10-09 ENCOUNTER — Ambulatory Visit (HOSPITAL_COMMUNITY)
Admission: RE | Admit: 2015-10-09 | Discharge: 2015-10-09 | Disposition: A | Discharge status: Home / Self Care | Payer: BLUE CROSS/BLUE SHIELD | Attending: Psychiatry | Admitting: Psychiatry

## 2015-10-09 DIAGNOSIS — Z9889 Other specified postprocedural states: Secondary | ICD-10-CM | POA: Insufficient documentation

## 2015-10-09 DIAGNOSIS — M170 Bilateral primary osteoarthritis of knee: Secondary | ICD-10-CM | POA: Insufficient documentation

## 2015-10-09 DIAGNOSIS — F17210 Nicotine dependence, cigarettes, uncomplicated: Secondary | ICD-10-CM | POA: Diagnosis not present

## 2015-10-09 DIAGNOSIS — Z833 Family history of diabetes mellitus: Secondary | ICD-10-CM | POA: Diagnosis not present

## 2015-10-09 DIAGNOSIS — Z806 Family history of leukemia: Secondary | ICD-10-CM | POA: Insufficient documentation

## 2015-10-09 DIAGNOSIS — F330 Major depressive disorder, recurrent, mild: Secondary | ICD-10-CM | POA: Diagnosis not present

## 2015-10-09 NOTE — BH Assessment (Addendum)
Tele Assessment Note   Charles Blevins is an 60 y.o. male. Pt was talking with the chaplain at [facility] because his wife is currently ill. Pt's wife is not expected to survive after today. Per Chaplain the Pt made the statement that he would shoot himself with a gun if his wife died today. The Chaplain brought the Pt to [facility] to be seen. According to the Pt, he is not suicidal. Pt states that he was misunderstood by the Chaplain. Per the Pt he informed the Pt that his wife had cancer 3xs and he had SI between her 1st and 2nd cancer diagnosis. The Pt states he is not suicidal at this time. Pt denies previous SI attempts. Pt denies SA. Pt denies previous abuse. Pt reports a strained relationship with some of his children but a close relationship with 1 of his sons. Pt reports career and financial stress. Pt states "I will not kill myself because God does not agree and I have to do somethings for my wife I asked me to dol." Pt denies having a gun.  Collateral Information from [place]: Per Hollice Espy he does not feel his father is a harm to himself. Hollice Espy states he will monitor his father and remove all weapons from the home. The writer met with Hollice Espy FF to go over SI prevention. The Probation officer provided Hollice Espy with a SI pamphlet and advised him to contact [facility] or [facility] if his father began to discuss SI or appear to be a harm to himself or others.  Writer consulted with Delphia Grates, NP. Per Delphia Grates, NP Pt does not meet inpatient criteria. Recommends collateral contact and providing the Pt with outpatient resources.   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 Back pain   . Diabetes mellitus without complication ([place])   . Fibromyalgia   . Seizures ([place])   . Arthritis, bil knees with chronic pain 11/07/2013  . Knee pain     Past Surgical History  Procedure Laterality Date  . Knee surgery      Family History:  Family History  Problem Relation Age of  Onset  . Diabetes Mother   . Leukemia Father 44  . Healthy Sister   . Healthy Brother   . Healthy Sister   . Healthy Brother   . Healthy Brother   . Healthy Brother   . Seizures Neg Hx     Social History:  reports that he has been smoking Cigarettes.  He has been smoking about 1.00 pack per day. He has never used smokeless tobacco. He reports that he does not drink alcohol or use illicit drugs.  Additional Social History:  Alcohol / Drug Use Pain Medications: Pt denies Prescriptions: Pt denies Over the Counter: Pt denies History of alcohol / drug use?: No history of alcohol / drug abuse Longest period of sobriety (when/how long): NA  CIWA:   COWS:    PATIENT STRENGTHS: (choose at least two) Capable of independent living Communication skills  Allergies: No Known Allergies  Home Medications:  (Not in a hospital admission)  OB/GYN Status:  No LMP for male patient.  General Assessment Data Location of Assessment: [facility] TTS Assessment: In system Is this a Tele or Face-to-Face Assessment?: Face-to-Face Is this an Initial Assessment or a Re-assessment for this encounter?: Initial Assessment Marital status: Married [place] name: NA Is patient pregnant?: No Pregnancy Status: No Living Arrangements: Spouse/significant other, Children Can pt return to  current living arrangement?: Yes Admission Status: Voluntary Is patient capable of signing voluntary admission?: Yes Referral Source: Self/Family/Friend Insurance type: SP     Crisis Care Plan Living Arrangements: Spouse/significant other, Children Legal Guardian: Other: (self) Name of Psychiatrist: NA Name of Therapist: NA  Education Status Is patient currently in school?: No Current Grade: NA Highest grade of school patient has completed: some college Name of school: NA Contact person: NA  Risk to self with the past 6 months Suicidal Ideation: No-Not Currently/Within Last 6 Months Has patient  been a risk to self within the past 6 months prior to admission? : No Suicidal Intent: No Has patient had any suicidal intent within the past 6 months prior to admission? : No Is patient at risk for suicide?: No Suicidal Plan?: No Has patient had any suicidal plan within the past 6 months prior to admission? : No Access to Means: Yes Specify Access to Suicidal Means: acess to a gun What has been your use of drugs/alcohol within the last 12 months?: NA Previous Attempts/Gestures: No How many times?: 0 Other Self Harm Risks: NA Triggers for Past Attempts: None known Intentional Self Injurious Behavior: None Family Suicide History: No Recent stressful life event(s): Other (Comment) (wife dying) Persecutory voices/beliefs?: No Depression: Yes Depression Symptoms: Isolating, Loss of interest in usual pleasures, Feeling worthless/self pity, Feeling angry/irritable Substance abuse history and/or treatment for substance abuse?: No Suicide prevention information given to non-admitted patients: Not applicable  Risk to Others within the past 6 months Homicidal Ideation: No Does patient have any lifetime risk of violence toward others beyond the six months prior to admission? : No Thoughts of Harm to Others: No Current Homicidal Intent: No Current Homicidal Plan: No Access to Homicidal Means: No Identified Victim: NA History of harm to others?: No Assessment of Violence: None Noted Violent Behavior Description: NA Does patient have access to weapons?: Yes (Comment) Criminal Charges Pending?: No Does patient have a court date: No Is patient on probation?: No  Psychosis Hallucinations: None noted Delusions: None noted  Mental Status Report Appearance/Hygiene: Unremarkable Eye Contact: Good Motor Activity: Freedom of movement Speech: Logical/coherent Level of Consciousness: Alert Mood: Sad Affect: Sad Anxiety Level: None Thought Processes: Coherent, Relevant Judgement:  Unimpaired Orientation: Person, Place, Time, Situation, Appropriate for developmental age Obsessive Compulsive Thoughts/Behaviors: None  Cognitive Functioning Concentration: Normal Memory: Recent Intact, Remote Intact IQ: Average Insight: Fair Impulse Control: Fair Appetite: Fair Weight Loss: 0 Weight Gain: 0 Sleep: Decreased Total Hours of Sleep: 5 Vegetative Symptoms: None  ADLScreening [facility] Assessment Services) Patient's cognitive ability adequate to safely complete daily activities?: Yes Patient able to express need for assistance with ADLs?: Yes Independently performs ADLs?: Yes (appropriate for developmental age)  Prior Inpatient Therapy Prior Inpatient Therapy: No Prior Therapy Dates: na Prior Therapy Facilty/Provider(s): na Reason for Treatment: na  Prior Outpatient Therapy Prior Outpatient Therapy: No Prior Therapy Dates: na Prior Therapy Facilty/Provider(s): na Reason for Treatment: na Does patient have an ACCT team?: No Does patient have Intensive In-House Services?  : No Does patient have [facility] services? : No Does patient have P4CC services?: No  ADL Screening (condition at time of admission) Patient's cognitive ability adequate to safely complete daily activities?: Yes Is the patient deaf or have difficulty hearing?: No Does the patient have difficulty seeing, even when wearing glasses/contacts?: No Does the patient have difficulty concentrating, remembering, or making decisions?: No Patient able to express need for assistance with ADLs?: Yes Does the patient have difficulty dressing or  bathing?: No Independently performs ADLs?: Yes (appropriate for developmental age) Does the patient have difficulty walking or climbing stairs?: No Weakness of Legs: None Weakness of Arms/Hands: None       Abuse/Neglect Assessment (Assessment to be complete while patient is alone) Physical Abuse: Denies Verbal Abuse: Denies Sexual Abuse: Denies Exploitation of  patient/patient's resources: Denies Self-Neglect: Denies Values / Beliefs Cultural Requests During Hospitalization: None Spiritual Requests During Hospitalization: None   Advance Directives (For Healthcare) Does patient have an advance directive?: No Would patient like information on creating an advanced directive?: No - patient declined information    Additional Information 1:1 In Past 12 Months?: No CIRT Risk: No Elopement Risk: No Does patient have medical clearance?: Yes     Disposition:  Disposition Initial Assessment Completed for this Encounter: Yes Disposition of Patient: Outpatient treatment Type of inpatient treatment program: Adult Type of outpatient treatment: Adult  Cheri Ayotte D 10/09/2015 3:00 PM

## 2015-10-13 ENCOUNTER — Other Ambulatory Visit: Payer: Self-pay | Admitting: Neurology

## 2016-01-20 ENCOUNTER — Telehealth: Payer: Self-pay | Admitting: Neurology

## 2016-01-21 NOTE — Telephone Encounter (Signed)
sent medical records to Cameron AliMamdouch Riad, MD

## 2016-09-02 IMAGING — CT CT HEAD W/O CM
1 series · 16 of 30 positions shown, 20 images · non-contrast
Comparison: None.

CLINICAL DATA: Acute onset of confusion and altered mental status
today.

EXAM:
CT HEAD WITHOUT CONTRAST
TECHNIQUE: Contiguous axial images were obtained from the base of the skull
through the vertex without intravenous contrast.

[Series 2: head 5.0 h30s · axial · 0.47mm/px · z∈[-135,+20]mm · 16 of 35 slices shown, 20 images]
[im 2/35  brain]
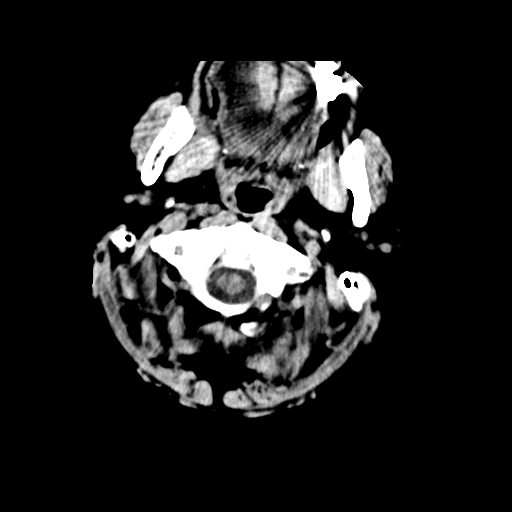
[im 2/35  bone]
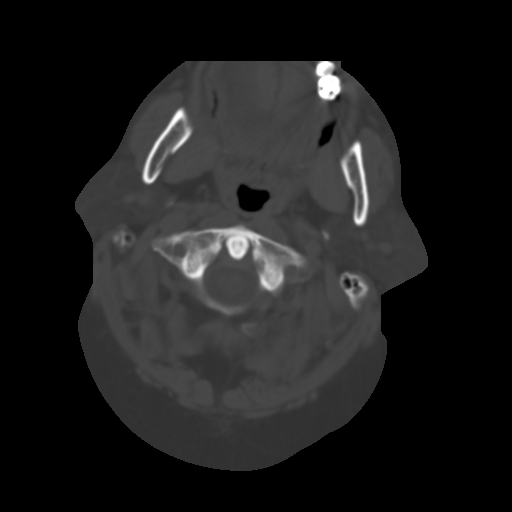
[im 4/35  brain]
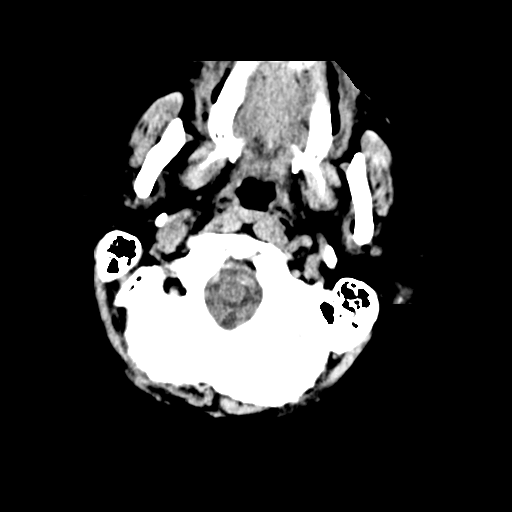
[im 6/35  brain]
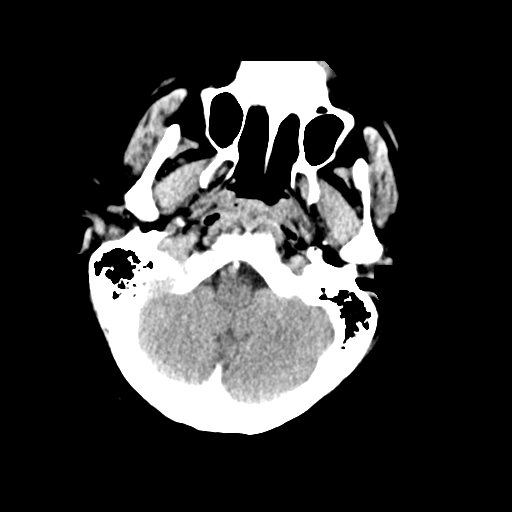
[im 9/35  brain]
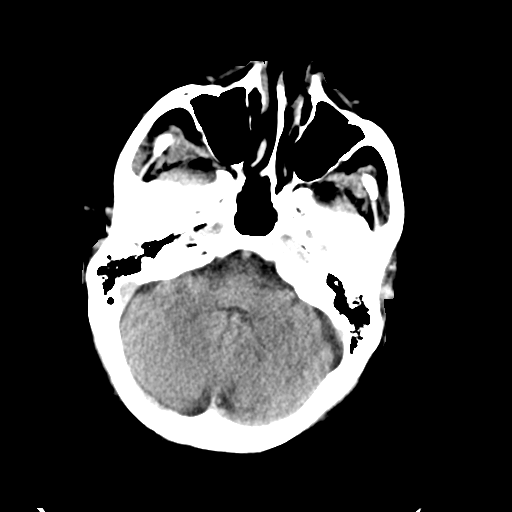
[im 10/35  brain]
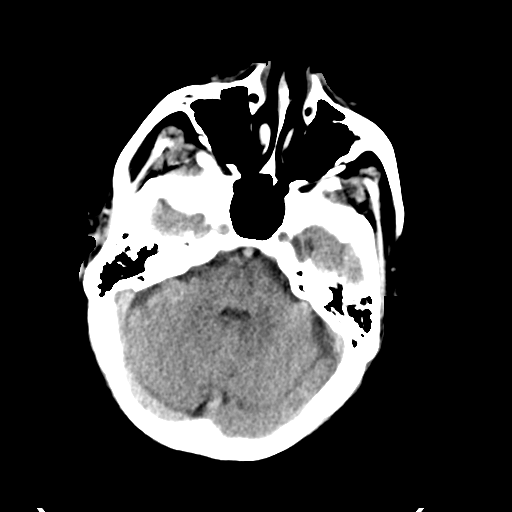
[im 10/35  bone]
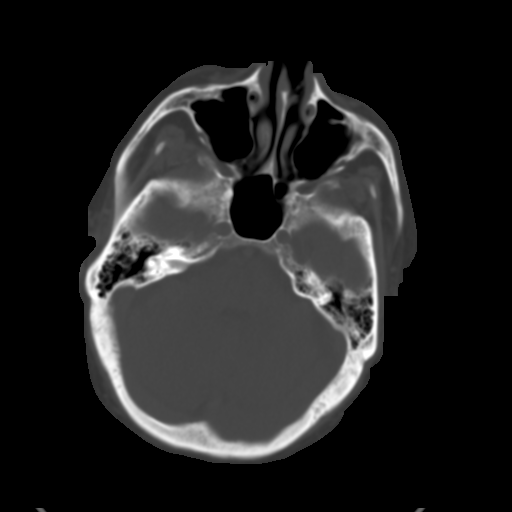
[im 12/35  brain]
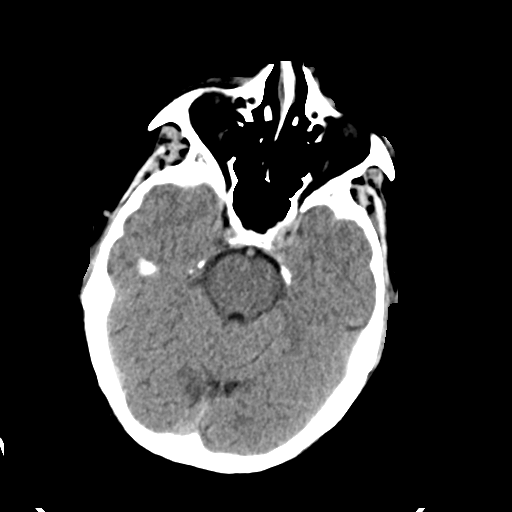
[im 15/35  brain]
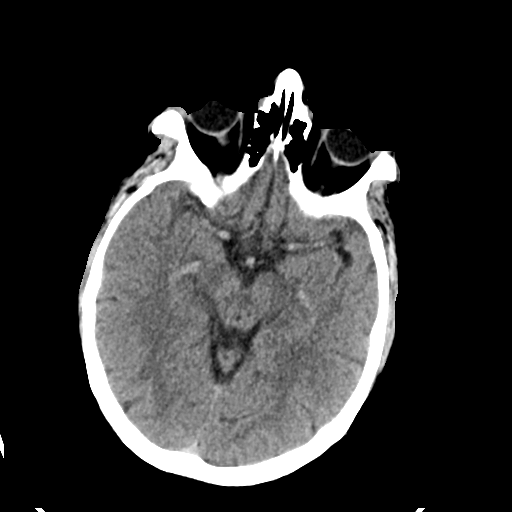
[im 17/35  brain]
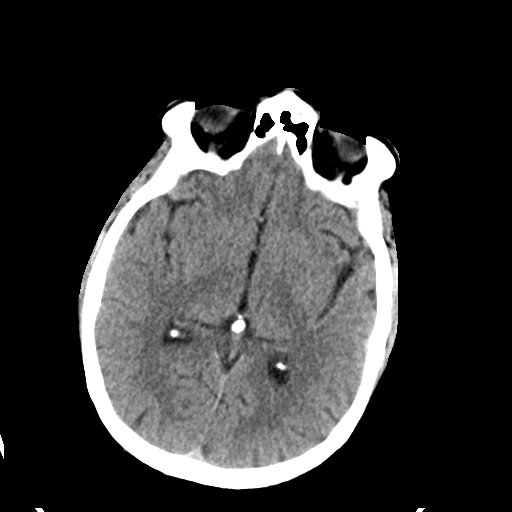
[im 18/35  brain]
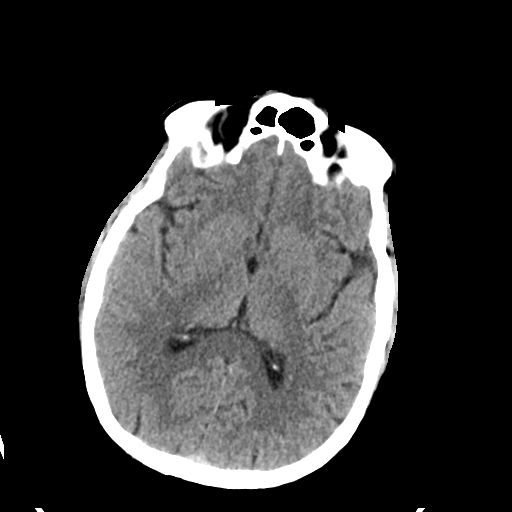
[im 18/35  bone]
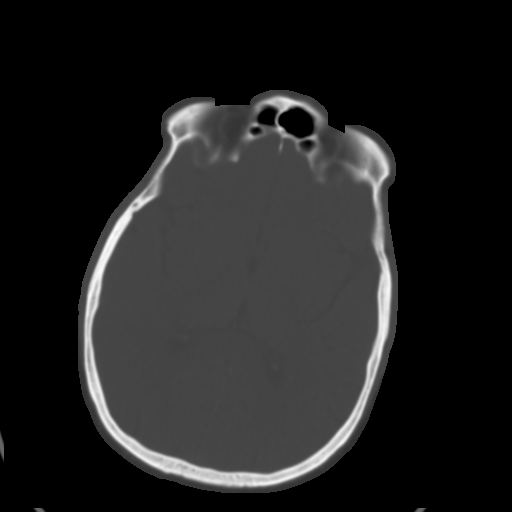
[im 20/35  brain]
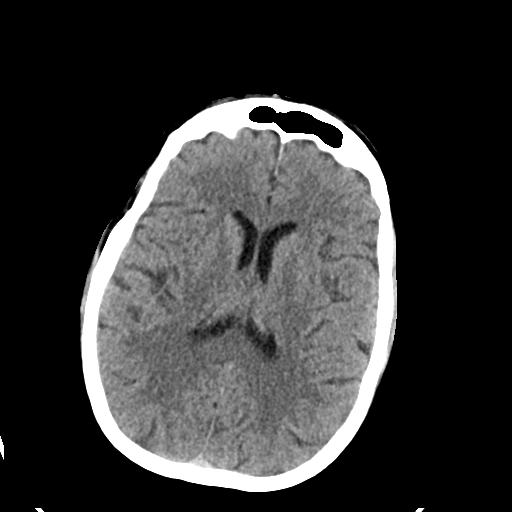
[im 23/35  brain]
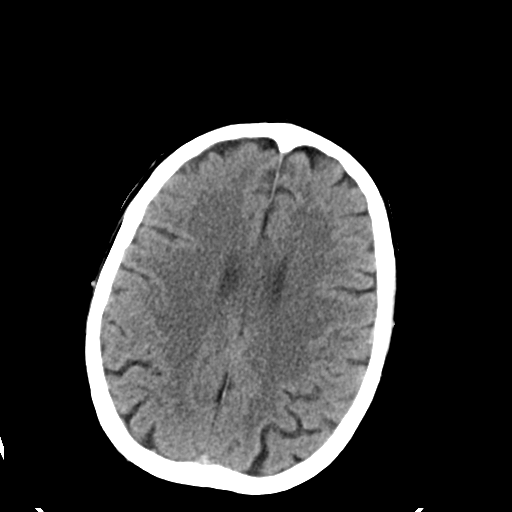
[im 25/35  brain]
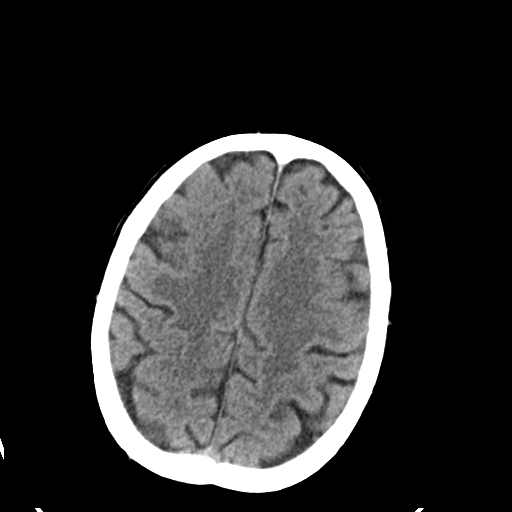
[im 26/35  brain]
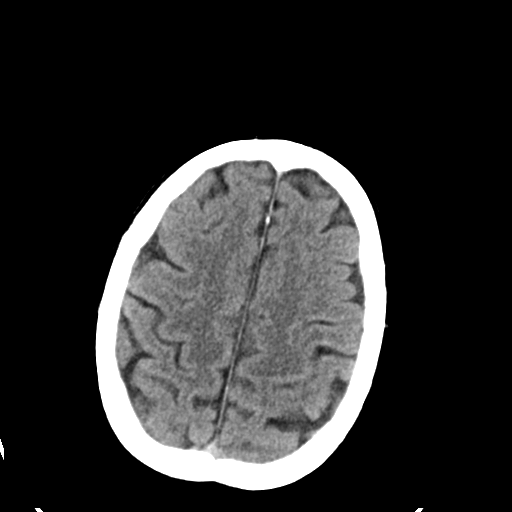
[im 26/35  bone]
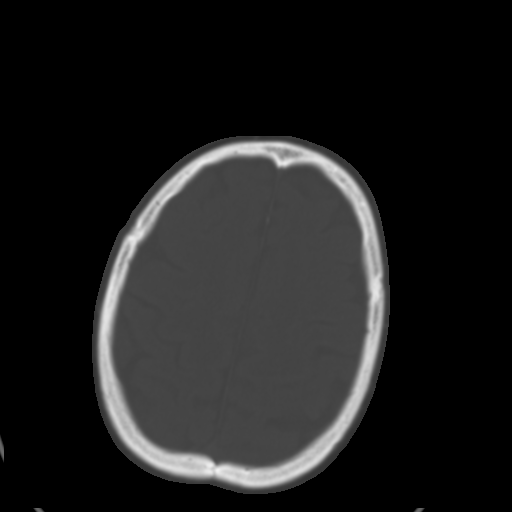
[im 29/35  brain]
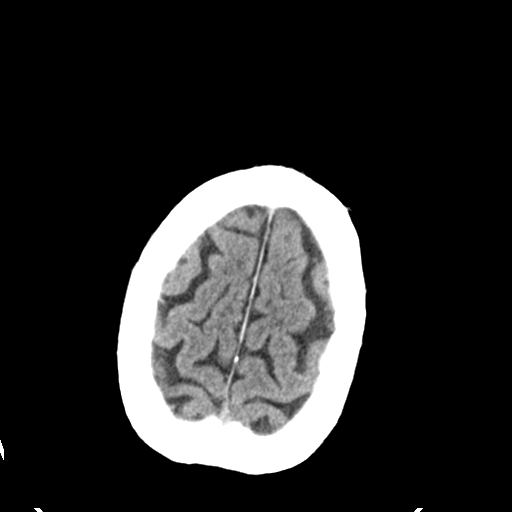
[im 31/35  brain]
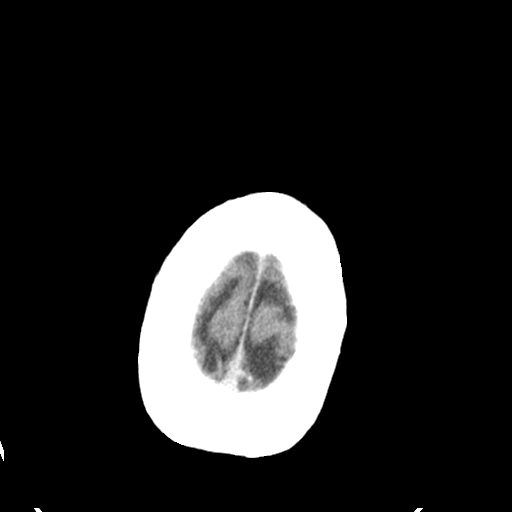
[im 33/35  brain]
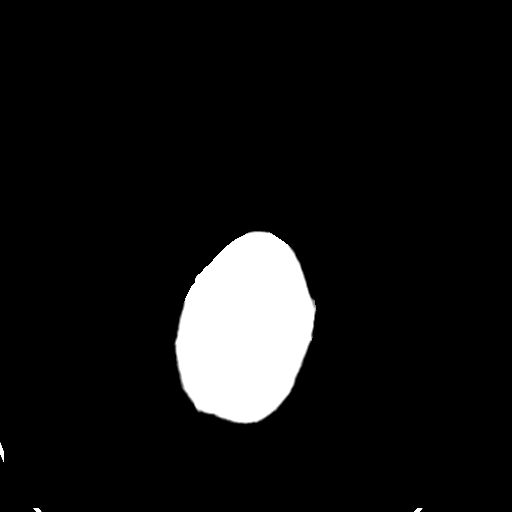

[16 of 30 positions shown; findings below may reference images not displayed]

FINDINGS: No evidence of intracranial hemorrhage, brain edema, or other signs
of acute infarction. No evidence of intracranial mass lesion or mass
effect.

No abnormal extraaxial fluid collections identified. Ventricles are
normal in size. No skull abnormality identified.
IMPRESSION: Negative noncontrast head CT.

## 2019-12-15 DEATH — deceased
